# Patient Record
Sex: Female | Born: 1970 | Race: White | Hispanic: No | Marital: Married | Smoking: Never smoker
Health system: Southern US, Community
[De-identification: ages and names within clinical notes are randomized; demographics above are authoritative.]

## PROBLEM LIST (undated history)

## (undated) DIAGNOSIS — E119 Type 2 diabetes mellitus without complications: Secondary | ICD-10-CM

## (undated) DIAGNOSIS — I272 Pulmonary hypertension, unspecified: Secondary | ICD-10-CM

## (undated) DIAGNOSIS — I10 Essential (primary) hypertension: Secondary | ICD-10-CM

## (undated) HISTORY — DX: Pulmonary hypertension, unspecified: I27.20

## (undated) HISTORY — DX: Essential (primary) hypertension: I10

## (undated) HISTORY — DX: Type 2 diabetes mellitus without complications: E11.9

---

## 2008-05-08 ENCOUNTER — Ambulatory Visit: Payer: Self-pay | Admitting: Internal Medicine

## 2011-08-31 ENCOUNTER — Ambulatory Visit: Payer: Self-pay

## 2012-01-27 ENCOUNTER — Ambulatory Visit: Payer: Self-pay

## 2012-08-31 ENCOUNTER — Ambulatory Visit: Payer: Self-pay

## 2013-09-04 ENCOUNTER — Ambulatory Visit: Payer: Self-pay

## 2013-12-23 IMAGING — CR DG HAND COMPLETE 3+V*L*
1 series · 3 of 3 positions shown · non-contrast
Comparison: none

REASON FOR EXAM: tingling pain
COMMENTS:

PROCEDURE:     KDR - KDXR HAND LT COMPLETE W/OBLIQUES  - January 27, 2012  [DATE]
RESULT:     No fracture, dislocation or other acute bony abnormality is
seen. No periosteal reactive changes are noted. No arthritic changes are
identified. No soft tissue foreign body is seen.

[Series 1: pa · 0.17mm/px · 3 of 3 slices shown]
[im 1/3]
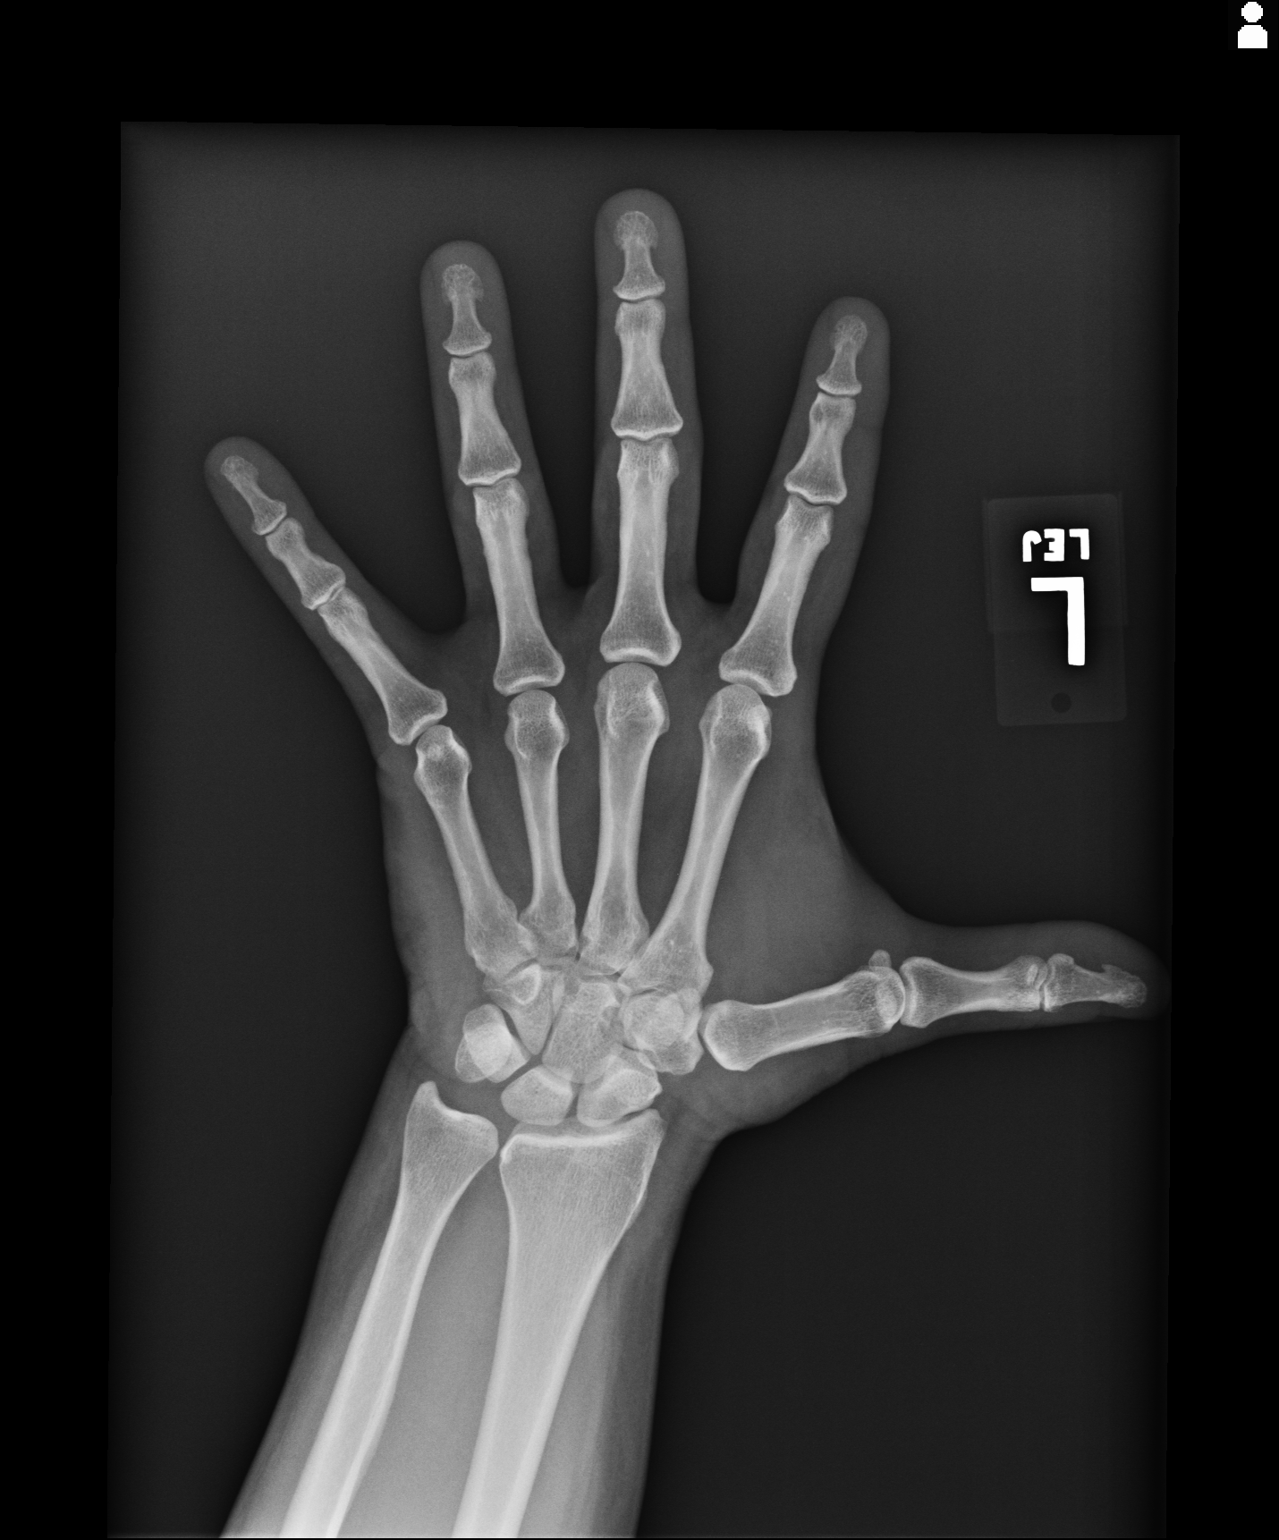
[im 2/3]
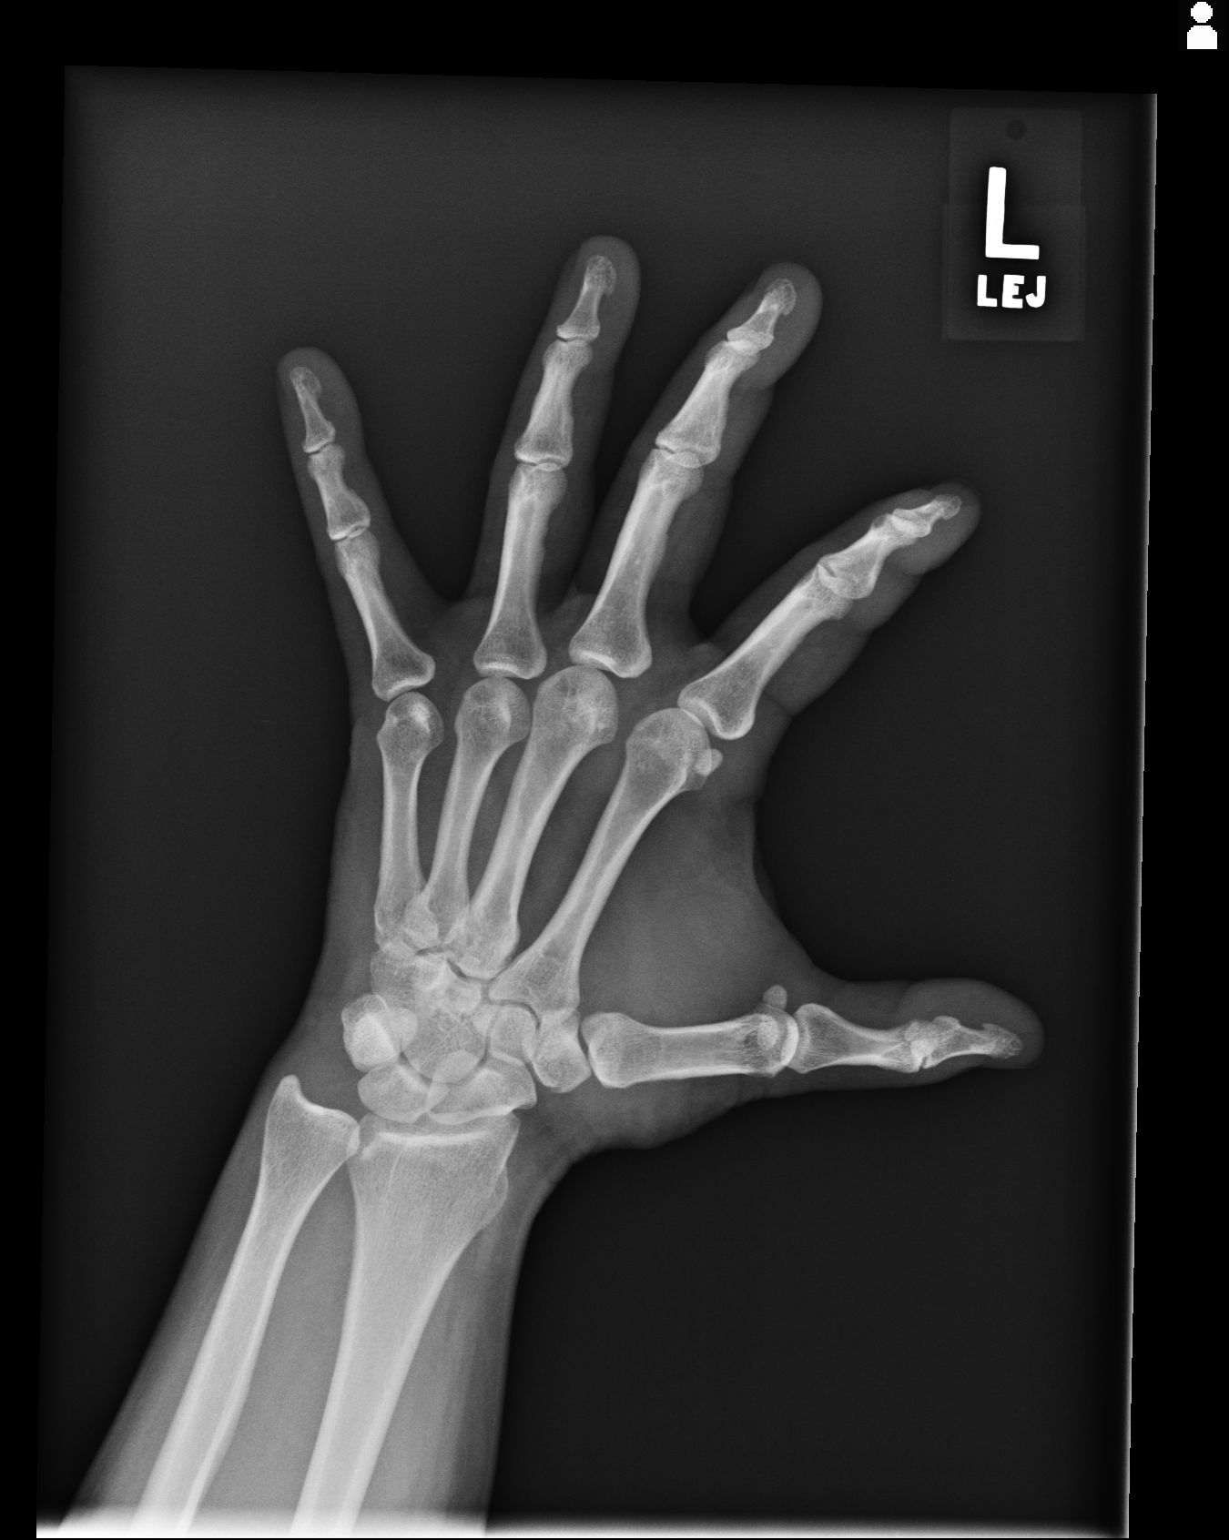
[im 3/3]
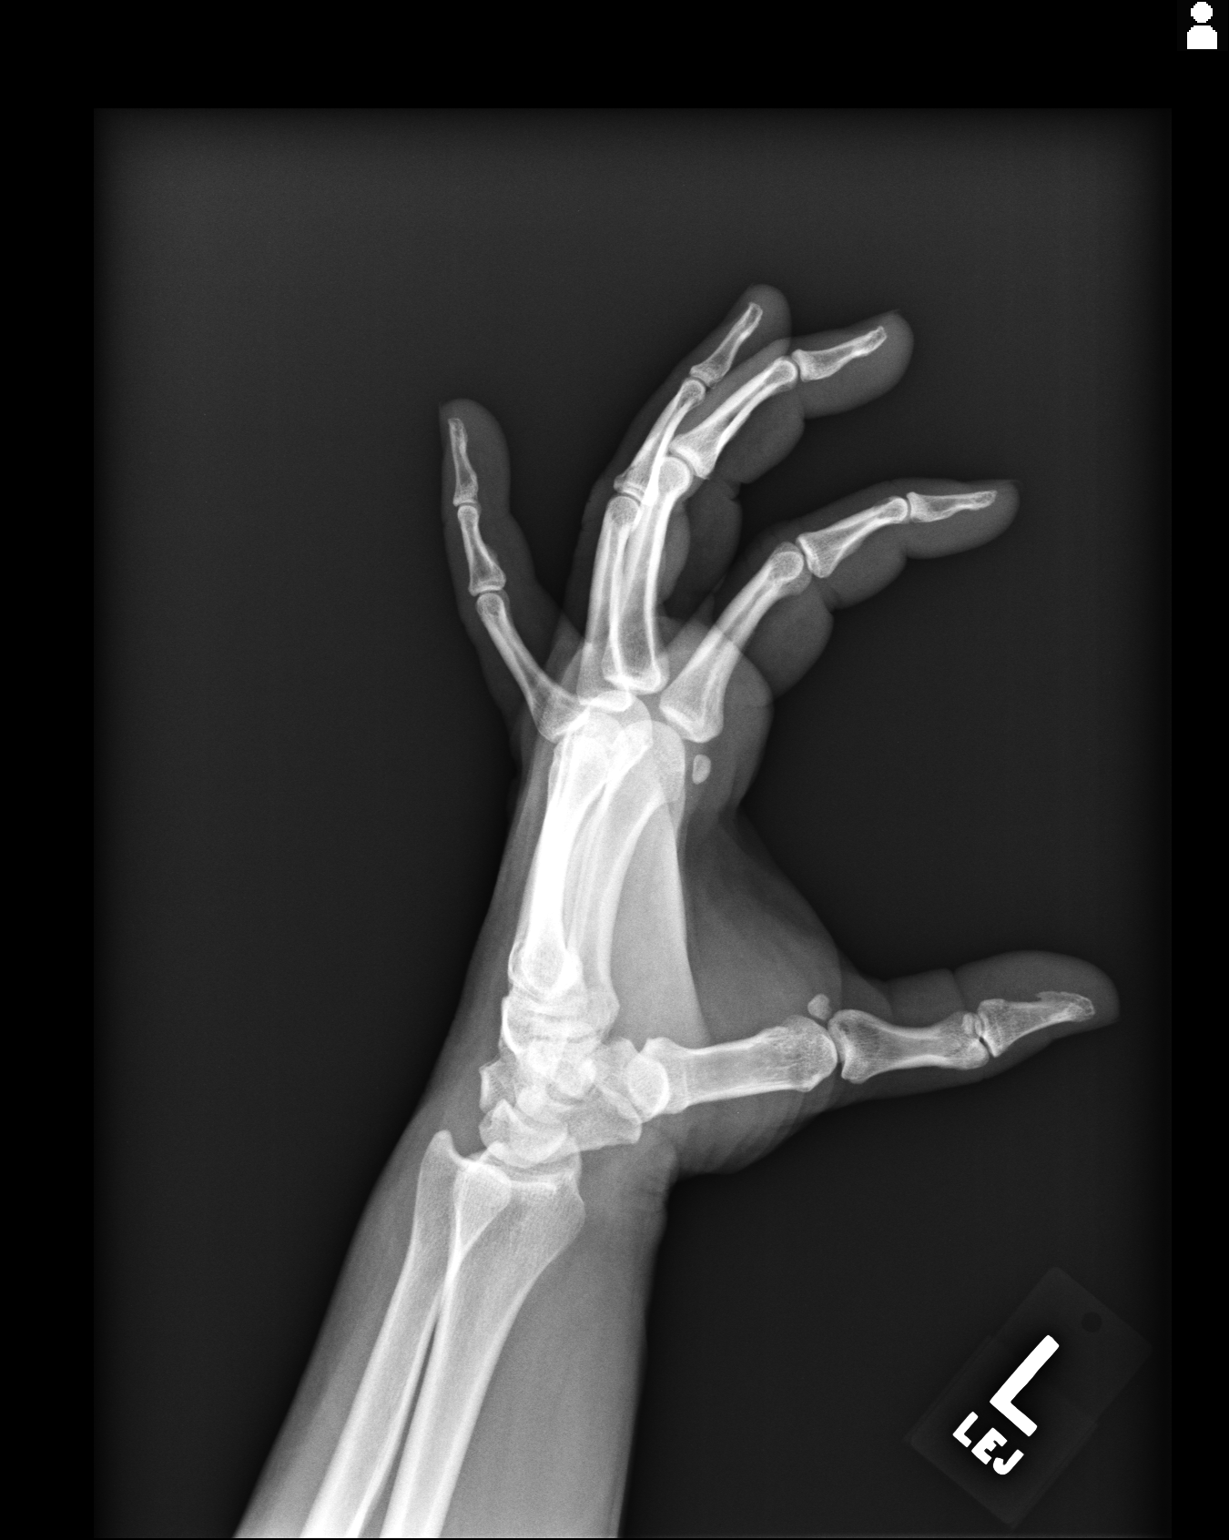

[3 of 3 positions shown; findings below may reference images not displayed]

IMPRESSION: 1.     No significant abnormalities are noted.

## 2014-07-28 IMAGING — MG MM CAD SCREENING MAMMO
1 series · 6 of 6 positions shown · non-contrast
Comparison: none

REASON FOR EXAM: SCR MAMMO NO ORDER
COMMENTS:

PROCEDURE:     MAM - MAM DGTL SCRN MAM NO ORDER W/CAD  - August 31, 2012  [DATE]
RESULT:     Scattered fibroglandular parenchymal pattern is present. No
mass. No pathologic clustered calcification. CAD evaluation nonfocal. Exam
stable from prior study of 08/31/2011.

[R CC · right · 6 of 6 slices shown]
[im 1/6]
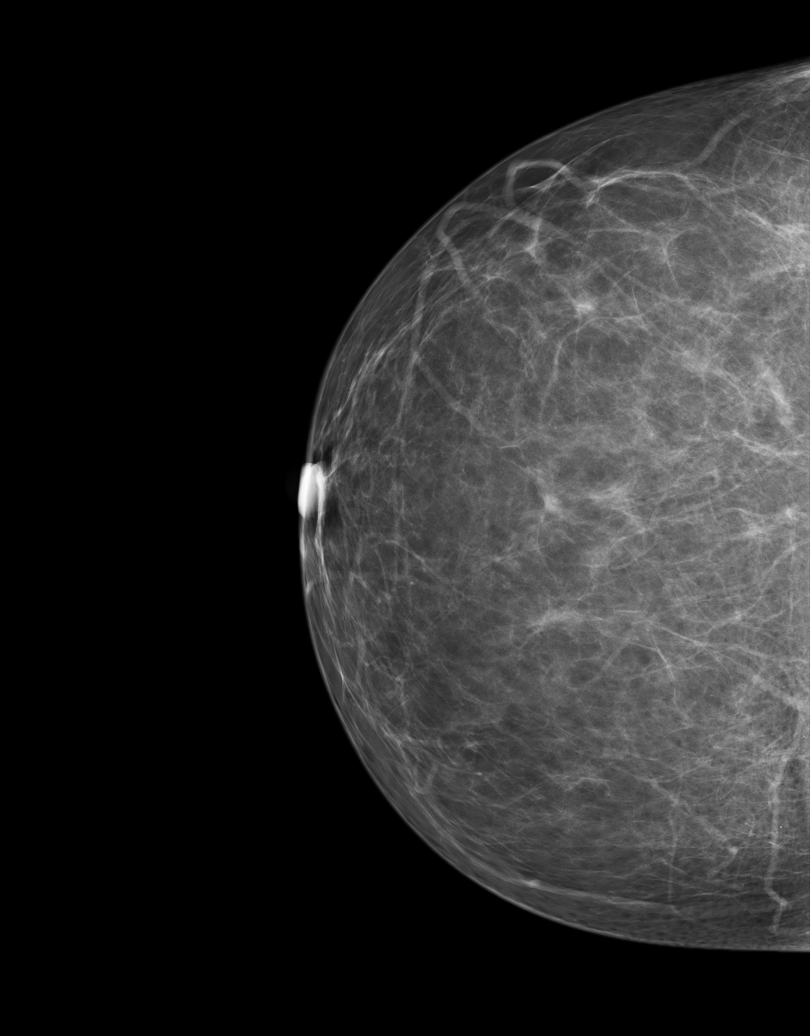
[im 2/6]
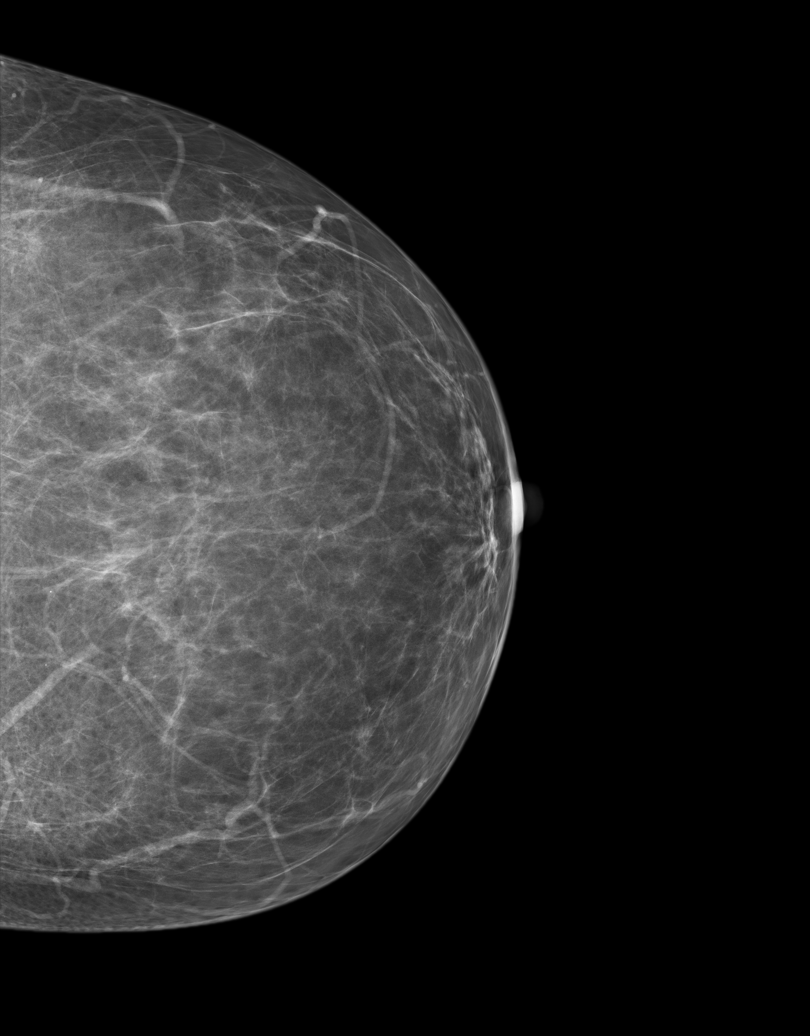
[im 3/6]
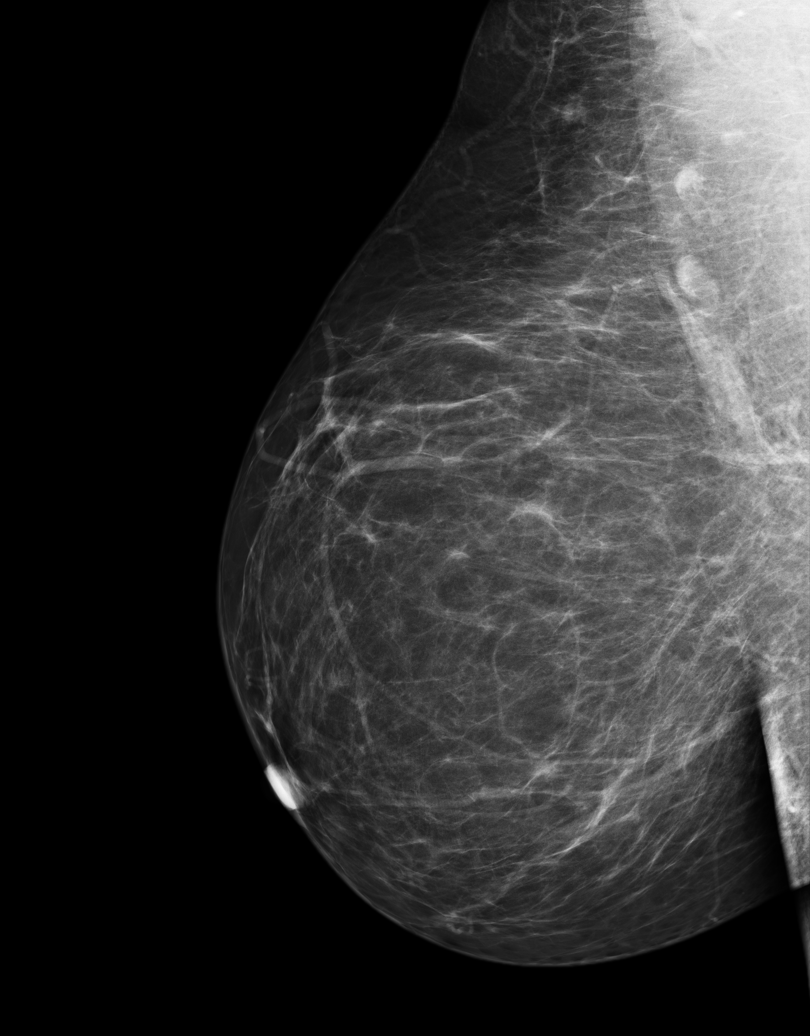
[im 4/6]
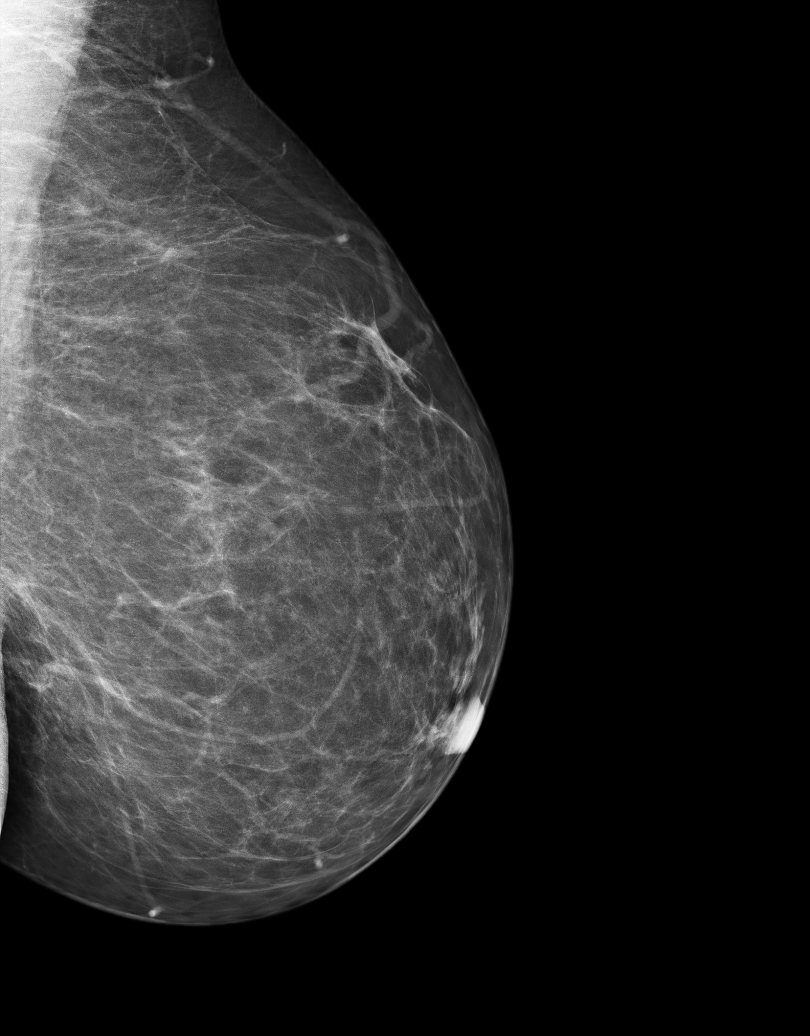
[im 5/6]
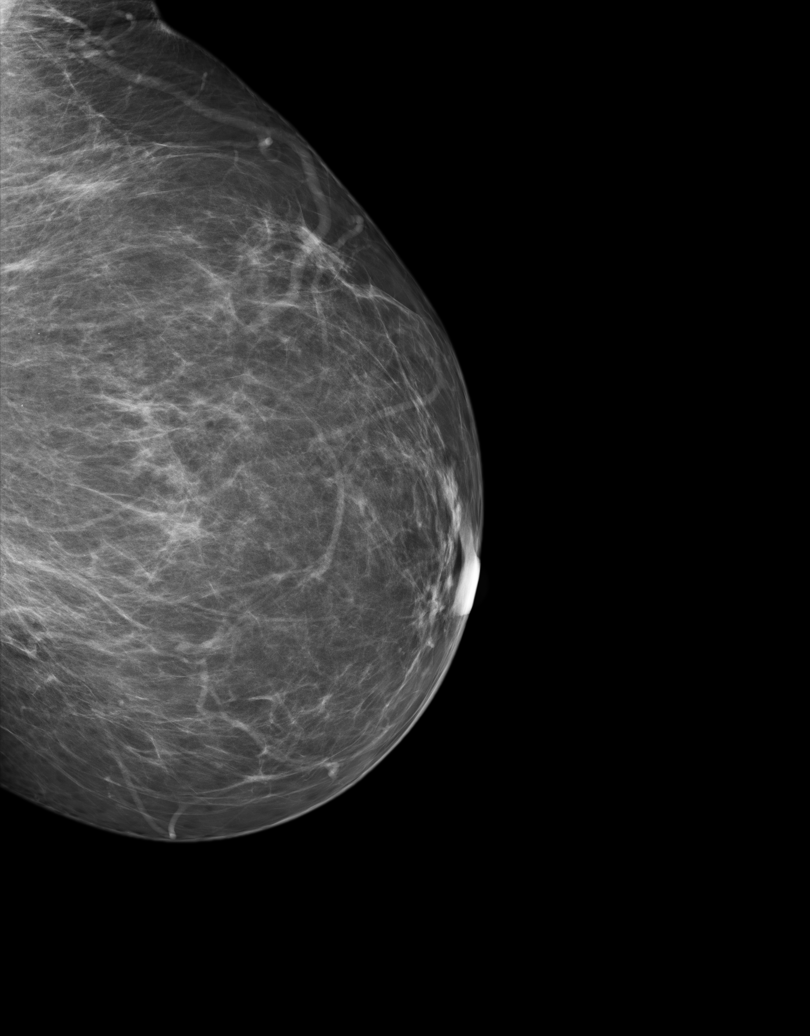
[im 6/6]
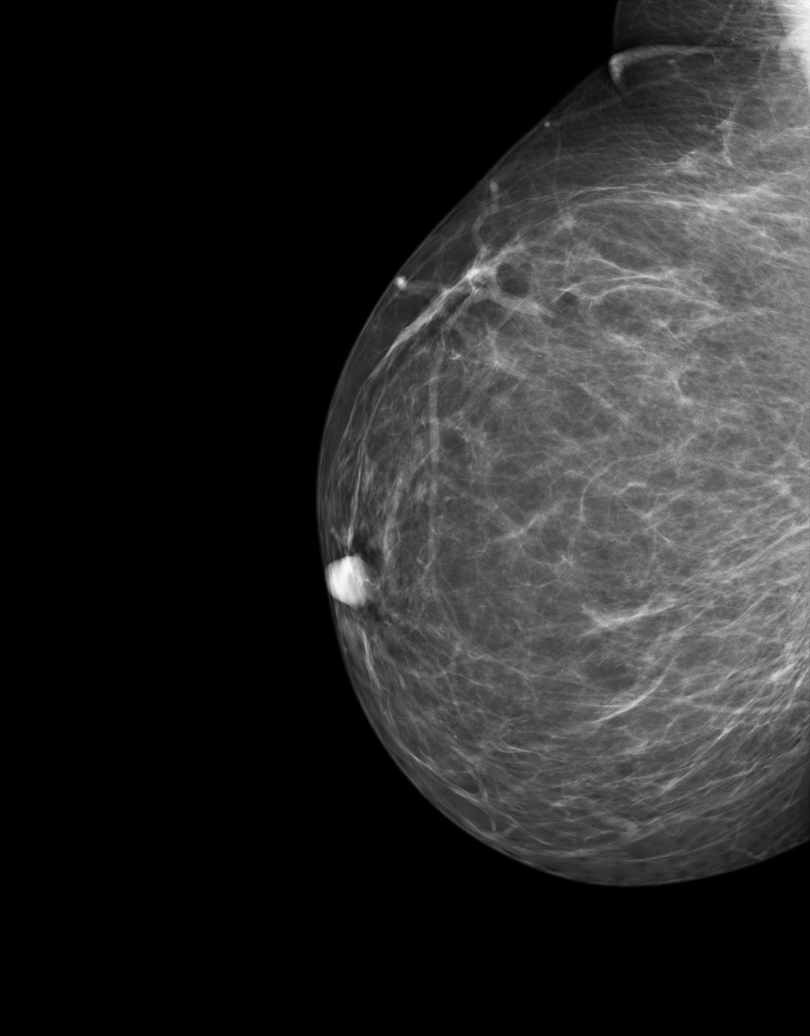

[6 of 6 positions shown; findings below may reference images not displayed]

IMPRESSION: Stable benign exam.

BI-RADS: Category 1 - Negative

A NEGATIVE MAMMOGRAM REPORT DOES NOT PRECLUDE BIOPSY OR OTHER EVALUATION OF
A CLINICALLY PALPABLE OR OTHERWISE SUSPICIOUS MASS OR LESION. BREAST CANCER
MAY NOT BE DETECTED IN UP TO 10% OF CASES.

## 2017-09-28 ENCOUNTER — Ambulatory Visit: Payer: Self-pay | Admitting: Nurse Practitioner

## 2017-11-09 ENCOUNTER — Other Ambulatory Visit: Payer: Self-pay

## 2017-11-09 MED ORDER — LISINOPRIL 40 MG PO TABS: ORAL_TABLET | ORAL | 1 refills | Status: DC

## 2017-11-09 MED ORDER — GLYBURIDE-METFORMIN 2.5-500 MG PO TABS: ORAL_TABLET | ORAL | 1 refills | Status: DC

## 2017-11-09 MED ORDER — FUROSEMIDE 20 MG PO TABS: ORAL_TABLET | ORAL | 5 refills | Status: DC

## 2017-11-15 ENCOUNTER — Ambulatory Visit: Payer: BLUE CROSS/BLUE SHIELD | Admitting: Nurse Practitioner

## 2017-11-15 ENCOUNTER — Encounter: Payer: Self-pay | Admitting: Nurse Practitioner

## 2017-11-15 VITALS — BP 146/98 | HR 79 | Resp 16 | Ht 64.0 in | Wt 223.2 lb

## 2017-11-15 DIAGNOSIS — I10 Essential (primary) hypertension: Secondary | ICD-10-CM | POA: Diagnosis not present

## 2017-11-15 DIAGNOSIS — E010 Iodine-deficiency related diffuse (endemic) goiter: Secondary | ICD-10-CM | POA: Diagnosis not present

## 2017-11-15 DIAGNOSIS — E1165 Type 2 diabetes mellitus with hyperglycemia: Secondary | ICD-10-CM

## 2017-11-15 LAB — POCT GLYCOSYLATED HEMOGLOBIN (HGB A1C): HEMOGLOBIN A1C: 7

## 2017-11-15 MED ORDER — ACCU-CHEK FASTCLIX LANCETS MISC: 4 refills | Status: AC

## 2017-11-15 MED ORDER — GLUCOSE BLOOD VI STRP: 1.0000 | ORAL_STRIP | Freq: Every day | 5 refills | Status: AC

## 2017-11-15 NOTE — Progress Notes (Signed)
[facility] [address] [place], [place] [address]  Internal MEDICINE  Office Visit Note  Patient Name: Carol Lewis  [id number]  [id number]  Date of Service: 11/24/2017  Chief Complaint  Patient presents with  . Diabetes    The patient has noted some swelling at the base of her throat and slightly over her right collar bone. Has been present for a few weeks. Did not fall or injure herself that she can remember. States that sometimes it feels like there is something "stuck" in her throat when she swallows.    Diabetes  She presents for her follow-up diabetic visit. She has type 2 diabetes mellitus. No MedicAlert identification noted. Her disease course has been stable. Hypoglycemia symptoms include headaches. Associated symptoms include fatigue, polydipsia and polyuria. Pertinent negatives for diabetes include no chest pain. There are no hypoglycemic complications. Symptoms are stable. There are no diabetic complications. Risk factors for coronary artery disease include dyslipidemia, hypertension and obesity. Current diabetic treatment includes oral agent (monotherapy). She is compliant with treatment all of the time. Her weight is stable. She is following a diabetic diet. Meal planning includes avoidance of concentrated sweets. She has not had a previous visit with a dietitian. She participates in exercise daily. There is no change in her home blood glucose trend. An ACE inhibitor/angiotensin II receptor blocker is being taken. She does not see a podiatrist.Eye exam is current.    Pt is here for routine follow up.    Current Medication: Outpatient Encounter Medications as of 11/15/2017  Medication Sig  . ACCU-CHEK FASTCLIX LANCETS MISC Use once daily diag e11.65  . furosemide (LASIX) 20 MG tablet Take 1 tab po by daily for swelling  . glucose blood (ACCU-CHEK GUIDE) test strip 1 each by Other route daily. Use as instructed  . glyBURIDE-metformin (GLUCOVANCE) 2.5-500 MG  tablet Take 1 tab by po in the morning and 2 tab in the evening  . hydrochlorothiazide (HYDRODIURIL) 25 MG tablet TK 1 T PO D FOR BLOOD PRESSURE  . lisinopril (PRINIVIL,ZESTRIL) 40 MG tablet Take 1 tab po daily  . [DISCONTINUED] ACCU-CHEK FASTCLIX LANCETS MISC by Does not apply route daily. Use once daily diag e11.65  . [DISCONTINUED] glucose blood (ACCU-CHEK GUIDE) test strip 1 each by Other route daily. Use as instructed    No facility-administered encounter medications on file as of 11/15/2017.     Surgical History: History reviewed. No pertinent surgical history.  Medical History: Past Medical History:  Diagnosis Date  . Diabetes mellitus without complication (HCC)   . Hypertension   . Pulmonary hypertension (HCC)     Family History: Family History  Problem Relation Age of Onset  . Breast cancer Mother   . Diabetes Mother   . Bladder Cancer Father     Social History   Socioeconomic History  . Marital status: Married    Spouse name: Not on file  . Number of children: Not on file  . Years of education: Not on file  . Highest education level: Not on file  Social Needs  . Financial resource strain: Not on file  . Food insecurity - worry: Not on file  . Food insecurity - inability: Not on file  . Transportation needs - medical: Not on file  . Transportation needs - non-medical: Not on file  Occupational History  . Not on file  Tobacco Use  . Smoking status: Never Smoker  . Smokeless tobacco: Never Used  Substance and Sexual Activity  . Alcohol  use: No    Frequency: Never  . Drug use: No  . Sexual activity: Not on file  Other Topics Concern  . Not on file  Social History Narrative  . Not on file      Review of Systems  Constitutional: Positive for fatigue. Negative for activity change and appetite change.  HENT: Negative for congestion, postnasal drip, sinus pressure, sinus pain, sneezing and voice change.        Feels swelling in her throat.   Eyes:  Negative for photophobia, pain, discharge, redness and itching.  Respiratory: Negative for shortness of breath and wheezing.   Cardiovascular: Negative for chest pain, palpitations and leg swelling.  Gastrointestinal: Negative for diarrhea, nausea and vomiting.  Endocrine: Positive for polydipsia and polyuria.  Musculoskeletal: Negative for arthralgias, back pain and myalgias.  Skin: Negative for rash.  Allergic/Immunologic: Positive for environmental allergies.  Neurological: Positive for headaches.  Hematological: Negative for adenopathy. Does not bruise/bleed easily.   Today's Vitals   11/15/17 1406  BP: (!) 146/98  Pulse: 79  Resp: 16  SpO2: 97%  Weight: 223 lb 3.2 oz (101.2 kg)  Height: 5\' 4"  (1.626 m)    Physical Exam  Constitutional: She is oriented to person, place, and time. She appears well-developed and well-nourished. No distress.  HENT:  Head: Normocephalic and atraumatic.  Mouth/Throat: Oropharynx is clear and moist. No oropharyngeal exudate.  Eyes: EOM are normal. Pupils are equal, round, and reactive to light.  Neck: Normal range of motion. Neck supple. No JVD present. No tracheal deviation present. Thyromegaly present.  Cardiovascular: Normal rate, regular rhythm and normal heart sounds. Exam reveals no gallop and no friction rub.  No murmur heard. Pulmonary/Chest: Effort normal and breath sounds normal. No respiratory distress. She has no wheezes. She has no rales. She exhibits no tenderness.  Abdominal: Soft. Bowel sounds are normal. There is no tenderness.  Musculoskeletal: Normal range of motion.  Lymphadenopathy:    She has no cervical adenopathy.  Neurological: She is alert and oriented to person, place, and time. No cranial nerve deficit.  Skin: Skin is warm and dry. She is not diaphoretic.  Psychiatric: She has a normal mood and affect. Her behavior is normal. Judgment and thought content normal.  Nursing note and vitals reviewed.  Assessment/Plan: 1.  Uncontrolled type 2 diabetes mellitus with hyperglycemia (HCC) - POCT HgB A1C is 7.0 today. Continue diabetic medication as prescribed. - glucose blood (ACCU-CHEK GUIDE) test strip; 1 each by Other route daily. Use as instructed  Dispense: 100 each; Refill: 5 - ACCU-CHEK FASTCLIX LANCETS MISC; Use once daily diag e11.65  Dispense: 100 each; Refill: 4  2. Thyromegaly - US Soft Tissue Head/Neck; Future  3. Essential hypertension Stable.continue bp medication as prescribed.   General Counseling: chyler creely understanding of the findings of todays visit and agrees with plan of treatment. I have discussed any further diagnostic evaluation that may be needed or ordered today. We also reviewed her medications today. she has been encouraged to call the office with any questions or concerns that should arise related to todays visit.  This patient was seen by Vincent Gros, FNP- C in Collaboration with Dr Lyndon Code as a part of collaborative care agreement    Orders Placed This Encounter  Procedures  . US Soft Tissue Head/Neck  . POCT HgB A1C    Meds ordered this encounter  Medications  . glucose blood (ACCU-CHEK GUIDE) test strip    Sig: 1 each by Other route  daily. Use as instructed    Dispense:  100 each    Refill:  5    Order Specific Question:   Supervising Provider    Answer:   Lyndon CodeKHAN, FOZIA M [[id number]]  . ACCU-CHEK FASTCLIX LANCETS MISC    Sig: Use once daily diag e11.65    Dispense:  100 each    Refill:  4    Order Specific Question:   Supervising Provider    Answer:   Lyndon CodeKHAN, FOZIA M [[id number]]    Time spent: 6515 Minutes    Dr Lyndon CodeFozia M Khan Internal medicine

## 2017-11-21 ENCOUNTER — Ambulatory Visit (INDEPENDENT_AMBULATORY_CARE_PROVIDER_SITE_OTHER): Payer: BLUE CROSS/BLUE SHIELD

## 2017-11-21 DIAGNOSIS — E010 Iodine-deficiency related diffuse (endemic) goiter: Secondary | ICD-10-CM

## 2017-11-24 ENCOUNTER — Encounter: Payer: Self-pay | Admitting: Nurse Practitioner

## 2017-11-24 DIAGNOSIS — I10 Essential (primary) hypertension: Secondary | ICD-10-CM | POA: Insufficient documentation

## 2017-12-01 ENCOUNTER — Telehealth: Payer: Self-pay

## 2017-12-01 NOTE — Telephone Encounter (Signed)
Advised pt of test results and that test will need to be repeated in 1 year.  dbs

## 2017-12-01 NOTE — Telephone Encounter (Signed)
-----   Message from Carlean JewsHeather E Boscia, NP sent at 11/28/2017  5:50 PM EST ----- - please let the patient know that ultrasound of thyroid showed two small nodules on right side of thyroid. Both less than 1cm in diameter. Will watch. Repeat ultrasound in 1 year. thanks

## 2018-02-16 ENCOUNTER — Ambulatory Visit (INDEPENDENT_AMBULATORY_CARE_PROVIDER_SITE_OTHER): Payer: BLUE CROSS/BLUE SHIELD | Admitting: Nurse Practitioner

## 2018-02-16 ENCOUNTER — Encounter: Payer: Self-pay | Admitting: Nurse Practitioner

## 2018-02-16 VITALS — BP 159/98 | HR 80 | Resp 16 | Ht 63.0 in | Wt 222.0 lb

## 2018-02-16 DIAGNOSIS — I10 Essential (primary) hypertension: Secondary | ICD-10-CM | POA: Diagnosis not present

## 2018-02-16 DIAGNOSIS — R300 Dysuria: Secondary | ICD-10-CM | POA: Diagnosis not present

## 2018-02-16 DIAGNOSIS — Z0001 Encounter for general adult medical examination with abnormal findings: Secondary | ICD-10-CM | POA: Diagnosis not present

## 2018-02-16 DIAGNOSIS — E559 Vitamin D deficiency, unspecified: Secondary | ICD-10-CM

## 2018-02-16 DIAGNOSIS — E1165 Type 2 diabetes mellitus with hyperglycemia: Secondary | ICD-10-CM

## 2018-02-16 DIAGNOSIS — E782 Mixed hyperlipidemia: Secondary | ICD-10-CM | POA: Diagnosis not present

## 2018-02-16 DIAGNOSIS — Z1239 Encounter for other screening for malignant neoplasm of breast: Secondary | ICD-10-CM

## 2018-02-16 DIAGNOSIS — Z23 Encounter for immunization: Secondary | ICD-10-CM | POA: Diagnosis not present

## 2018-02-16 DIAGNOSIS — Z1231 Encounter for screening mammogram for malignant neoplasm of breast: Secondary | ICD-10-CM

## 2018-02-16 LAB — POCT GLYCOSYLATED HEMOGLOBIN (HGB A1C): Hemoglobin A1C: 7.1 % — AB (ref 4.0–5.6)

## 2018-02-16 MED ORDER — TETANUS-DIPHTH-ACELL PERTUSSIS 5-2.5-18.5 LF-MCG/0.5 IM SUSP: 0.5000 mL | Freq: Once | INTRAMUSCULAR | 0 refills | Status: AC

## 2018-02-16 MED ORDER — HYDROCHLOROTHIAZIDE 25 MG PO TABS: 25.0000 mg | ORAL_TABLET | Freq: Every day | ORAL | 5 refills | Status: DC

## 2018-02-16 MED ORDER — GLYBURIDE-METFORMIN 2.5-500 MG PO TABS: ORAL_TABLET | ORAL | 5 refills | Status: DC

## 2018-02-16 MED ORDER — LISINOPRIL 40 MG PO TABS: ORAL_TABLET | ORAL | 5 refills | Status: DC

## 2018-02-16 NOTE — Progress Notes (Signed)
[facility] [address] [place], [place] [address]  Internal MEDICINE  Office Visit Note  Patient Name: Carol Lewis  [id number]  [id number]  Date of Service: 03/12/2018   Pt is here for routine health maintenance examination  Chief Complaint  Patient presents with  . Annual Exam  . Diabetes  . Hypertension     Diabetes  She presents for her follow-up diabetic visit. She has type 2 diabetes mellitus. Her disease course has been stable. There are no hypoglycemic associated symptoms. Pertinent negatives for hypoglycemia include no dizziness, headaches or nervousness/anxiousness. Associated symptoms include fatigue, polydipsia and polyuria. Pertinent negatives for diabetes include no chest pain. There are no hypoglycemic complications. Symptoms are stable. There are no diabetic complications. Risk factors for coronary artery disease include diabetes mellitus, dyslipidemia, hypertension, obesity and post-menopausal. Current diabetic treatment includes oral agent (dual therapy). She is compliant with treatment most of the time. She is following a generally healthy diet. She has had a previous visit with a dietitian. She participates in exercise daily. There is no change in her home blood glucose trend. An ACE inhibitor/angiotensin II receptor blocker is being taken. She does not see a podiatrist.Eye exam is current.  Hypertension  The current episode started more than 1 year ago. The problem is unchanged. The problem is controlled. Associated symptoms include peripheral edema. Pertinent negatives include no chest pain, headaches, palpitations or shortness of breath. There are no associated agents to hypertension. Risk factors for coronary artery disease include diabetes mellitus, dyslipidemia, obesity and post-menopausal state. Past treatments include ACE inhibitors and diuretics. The current treatment provides moderate improvement. There are no compliance problems.       Current Medication: Outpatient Encounter Medications as of 02/16/2018  Medication Sig  . ACCU-CHEK FASTCLIX LANCETS MISC Use once daily diag e11.65  . furosemide (LASIX) 20 MG tablet Take 1 tab po by daily for swelling  . glucose blood (ACCU-CHEK GUIDE) test strip 1 each by Other route daily. Use as instructed  . glyBURIDE-metformin (GLUCOVANCE) 2.5-500 MG tablet Take 1 tab by po in the morning and 2 tab in the evening  . hydrochlorothiazide (HYDRODIURIL) 25 MG tablet Take 1 tablet (25 mg total) by mouth daily.  Marland Kitchen lisinopril (PRINIVIL,ZESTRIL) 40 MG tablet Take 1 tab po daily  . [EXPIRED] Tdap (BOOSTRIX) 5-2.5-18.5 LF-MCG/0.5 injection Inject 0.5 mLs into the muscle once for 1 dose.  . [DISCONTINUED] glyBURIDE-metformin (GLUCOVANCE) 2.5-500 MG tablet Take 1 tab by po in the morning and 2 tab in the evening  . [DISCONTINUED] hydrochlorothiazide (HYDRODIURIL) 25 MG tablet TK 1 T PO D FOR BLOOD PRESSURE  . [DISCONTINUED] lisinopril (PRINIVIL,ZESTRIL) 40 MG tablet Take 1 tab po daily   No facility-administered encounter medications on file as of 02/16/2018.     Surgical History: History reviewed. No pertinent surgical history.  Medical History: Past Medical History:  Diagnosis Date  . Diabetes mellitus without complication (HCC)   . Hypertension   . Pulmonary hypertension (HCC)     Family History: Family History  Problem Relation Age of Onset  . Breast cancer Mother   . Diabetes Mother   . Bladder Cancer Father       Review of Systems  Constitutional: Positive for fatigue. Negative for activity change and appetite change.  HENT: Negative for congestion, postnasal drip, sinus pressure, sinus pain, sneezing and voice change.        Feels swelling in her throat.   Eyes: Negative.  Negative for photophobia, pain, discharge, redness and  itching.  Respiratory: Negative for cough, shortness of breath and wheezing.   Cardiovascular: Negative for chest pain, palpitations and leg  swelling.  Gastrointestinal: Negative for abdominal pain, diarrhea, nausea and vomiting.  Endocrine: Positive for polydipsia and polyuria.  Genitourinary: Negative for dysuria, flank pain, frequency, menstrual problem and urgency.  Musculoskeletal: Negative for arthralgias, back pain and myalgias.  Skin: Negative for rash.  Allergic/Immunologic: Positive for environmental allergies.  Neurological: Negative for dizziness, light-headedness and headaches.  Hematological: Negative for adenopathy. Does not bruise/bleed easily.  Psychiatric/Behavioral: Negative for dysphoric mood and sleep disturbance. The patient is not nervous/anxious.      Today's Vitals   02/16/18 1458  BP: (!) 159/98  Pulse: 80  Resp: 16  SpO2: 98%  Weight: 222 lb (100.7 kg)  Height:  (1.6 m)    Physical Exam  Constitutional: She is oriented to person, place, and time. She appears well-developed and well-nourished. No distress.  HENT:  Head: Normocephalic and atraumatic.  Nose: Nose normal.  Mouth/Throat: Oropharynx is clear and moist. No oropharyngeal exudate.  Eyes: Pupils are equal, round, and reactive to light. Conjunctivae and EOM are normal.  Neck: Normal range of motion. Neck supple. No JVD present. No tracheal deviation present. Thyromegaly present.  Cardiovascular: Normal rate, regular rhythm, normal heart sounds and intact distal pulses. Exam reveals no gallop and no friction rub.  No murmur heard. Pulses:      Dorsalis pedis pulses are 2+ on the right side, and 2+ on the left side.       Posterior tibial pulses are 2+ on the right side, and 2+ on the left side.  Pulmonary/Chest: Effort normal and breath sounds normal. No respiratory distress. She has no wheezes. She has no rales. She exhibits no tenderness. Right breast exhibits no inverted nipple, no mass, no nipple discharge, no skin change and no tenderness. Left breast exhibits no inverted nipple, no mass, no nipple discharge, no skin change and  no tenderness.  Abdominal: Soft. Bowel sounds are normal. There is no tenderness.  Musculoskeletal: Normal range of motion.       Right foot: There is normal range of motion and no deformity.       Left foot: There is normal range of motion and no deformity.  Feet:  Right Foot:  Protective Sensation: 6 sites tested. 6 sites sensed.  Skin Integrity: Negative for ulcer, blister or warmth.  Left Foot:  Protective Sensation: 6 sites tested. 6 sites sensed.  Skin Integrity: Negative for ulcer, blister, skin breakdown or warmth.  Lymphadenopathy:    She has no cervical adenopathy.  Neurological: She is alert and oriented to person, place, and time. No cranial nerve deficit.  Skin: Skin is warm and dry. Capillary refill takes less than 2 seconds. She is not diaphoretic.  Psychiatric: She has a normal mood and affect. Her behavior is normal. Judgment and thought content normal.  Nursing note and vitals reviewed.    LABS: Recent Results (from the past 2160 hour(s))  Urinalysis, Routine w reflex microscopic     Status: None   Collection Time: 02/16/18 12:00 AM  Result Value Ref Range   Specific Gravity, UA 1.016 1.005 - 1.030   pH, UA 5.5 5.0 - 7.5   Color, UA Yellow Yellow   Appearance Ur Clear Clear   Leukocytes, UA Negative Negative   Protein, UA Negative Negative/Trace   Glucose, UA Negative Negative   Ketones, UA Negative Negative   RBC, UA Negative Negative  Bilirubin, UA Negative Negative   Urobilinogen, Ur 0.2 0.2 - 1.0 mg/dL   Nitrite, UA Negative Negative   Microscopic Examination Comment     Comment: Microscopic not indicated and not performed.  POCT HgB A1C     Status: Abnormal   Collection Time: 02/16/18  3:19 PM  Result Value Ref Range   Hemoglobin A1C 7.1 (A) 4.0 - 5.6 %   HbA1c, POC (prediabetic range)  5.7 - 6.4 %   HbA1c, POC (controlled diabetic range)  0.0 - 7.0 %   Assessment/Plan: 1. Encounter for general adult medical examination with abnormal  findings Annual health maintenance exam today. - CBC with Differential/Platelet - Comprehensive metabolic panel - TSH - T4, free  2. Uncontrolled type 2 diabetes mellitus with hyperglycemia (HCC) - POCT HgB A1C 7.1 today. Continue diabetic medication as prescribed. Refer for diabetic eye exam. - Comprehensive metabolic panel - TSH - T4, free - Microalbumin / creatinine urine ratio - Ambulatory referral to Ophthalmology - glyBURIDE-metformin (GLUCOVANCE) 2.5-500 MG tablet; Take 1 tab by po in the morning and 2 tab in the evening  Dispense: 90 tablet; Refill: 5  3. Essential hypertension - Comprehensive metabolic panel - hydrochlorothiazide (HYDRODIURIL) 25 MG tablet; Take 1 tablet (25 mg total) by mouth daily.  Dispense: 30 tablet; Refill: 5 - lisinopril (PRINIVIL,ZESTRIL) 40 MG tablet; Take 1 tab po daily  Dispense: 30 tablet; Refill: 5  4. Mixed hyperlipidemia - Lipid panel  5. Vitamin D deficiency - Vitamin D 1,25 dihydroxy  6. Dysuria - Urinalysis, Routine w reflex microscopic  7. Screening for breast cancer - MM DIGITAL SCREENING BILATERAL; Future  8. Need for prophylactic vaccination against diphtheria-tetanus-pertussis (DTP) - Tdap (BOOSTRIX) 5-2.5-18.5 LF-MCG/0.5 injection; Inject 0.5 mLs into the muscle once for 1 dose.  Dispense: 0.5 mL; Refill: 0  General Counseling: Lincoln verbalizes understanding of the findings of todays visit and agrees with plan of treatment. I have discussed any further diagnostic evaluation that may be needed or ordered today. We also reviewed her medications today. she has been encouraged to call the office with any questions or concerns that should arise related to todays visit.    Counseling:  Diabetes Counseling:  1. Addition of ACE inh/ ARB'S for nephroprotection. 2. Diabetic foot care, prevention of complications.  3.Exercise and lose weight.  4. Diabetic eye examination, 5. Monitor blood sugar closlely. nutrition counseling.   6.Sign and symptoms of hypoglycemia including shaking sweating,confusion and headaches.   This patient was seen by Vincent Gros, FNP- C in Collaboration with Dr Lyndon Code as a part of collaborative care agreement  Orders Placed This Encounter  Procedures  . MM DIGITAL SCREENING BILATERAL  . Urinalysis, Routine w reflex microscopic  . CBC with Differential/Platelet  . Comprehensive metabolic panel  . TSH  . T4, free  . Lipid panel  . Vitamin D 1,25 dihydroxy  . Microalbumin / creatinine urine ratio  . Ambulatory referral to Ophthalmology  . POCT HgB A1C    Meds ordered this encounter  Medications  . glyBURIDE-metformin (GLUCOVANCE) 2.5-500 MG tablet    Sig: Take 1 tab by po in the morning and 2 tab in the evening    Dispense:  90 tablet    Refill:  5    Order Specific Question:   Supervising Provider    Answer:   Lyndon Code [[id number]]  . hydrochlorothiazide (HYDRODIURIL) 25 MG tablet    Sig: Take 1 tablet (25 mg total) by mouth daily.  Dispense:  30 tablet    Refill:  5    Order Specific Question:   Supervising Provider    Answer:   Lyndon Code [[id number]]  . lisinopril (PRINIVIL,ZESTRIL) 40 MG tablet    Sig: Take 1 tab po daily    Dispense:  30 tablet    Refill:  5    Order Specific Question:   Supervising Provider    Answer:   Lyndon Code [[id number]]  . Tdap (BOOSTRIX) 5-2.5-18.5 LF-MCG/0.5 injection    Sig: Inject 0.5 mLs into the muscle once for 1 dose.    Dispense:  0.5 mL    Refill:  0    Order Specific Question:   Supervising Provider    Answer:   Lyndon Code [[id number]]    Time spent: 59 Minutes      Lyndon Code, MD  Internal Medicine

## 2018-02-17 LAB — URINALYSIS, ROUTINE W REFLEX MICROSCOPIC
BILIRUBIN UA: NEGATIVE
Glucose, UA: NEGATIVE
KETONES UA: NEGATIVE
LEUKOCYTES UA: NEGATIVE
Nitrite, UA: NEGATIVE
PH UA: 5.5 (ref 5.0–7.5)
PROTEIN UA: NEGATIVE
RBC UA: NEGATIVE
SPEC GRAV UA: 1.016 (ref 1.005–1.030)
UUROB: 0.2 mg/dL (ref 0.2–1.0)

## 2018-03-12 DIAGNOSIS — Z1239 Encounter for other screening for malignant neoplasm of breast: Secondary | ICD-10-CM

## 2018-03-12 DIAGNOSIS — E782 Mixed hyperlipidemia: Secondary | ICD-10-CM | POA: Insufficient documentation

## 2018-03-12 DIAGNOSIS — R300 Dysuria: Secondary | ICD-10-CM | POA: Insufficient documentation

## 2018-03-12 DIAGNOSIS — Z23 Encounter for immunization: Secondary | ICD-10-CM | POA: Insufficient documentation

## 2018-03-12 DIAGNOSIS — Z124 Encounter for screening for malignant neoplasm of cervix: Secondary | ICD-10-CM | POA: Insufficient documentation

## 2018-03-12 DIAGNOSIS — E559 Vitamin D deficiency, unspecified: Secondary | ICD-10-CM | POA: Insufficient documentation

## 2018-03-12 DIAGNOSIS — E1165 Type 2 diabetes mellitus with hyperglycemia: Secondary | ICD-10-CM | POA: Insufficient documentation

## 2018-06-16 LAB — CBC WITH DIFFERENTIAL/PLATELET
BASOS: 1 %
Basophils Absolute: 0.1 10*3/uL (ref 0.0–0.2)
EOS (ABSOLUTE): 0.5 10*3/uL — ABNORMAL HIGH (ref 0.0–0.4)
EOS: 7 %
HEMATOCRIT: 38.9 % (ref 34.0–46.6)
Hemoglobin: 13.4 g/dL (ref 11.1–15.9)
Immature Grans (Abs): 0 10*3/uL (ref 0.0–0.1)
Immature Granulocytes: 0 %
LYMPHS ABS: 1.6 10*3/uL (ref 0.7–3.1)
Lymphs: 21 %
MCH: 29.6 pg (ref 26.6–33.0)
MCHC: 34.4 g/dL (ref 31.5–35.7)
MCV: 86 fL (ref 79–97)
MONOCYTES: 4 %
MONOS ABS: 0.3 10*3/uL (ref 0.1–0.9)
Neutrophils Absolute: 4.9 10*3/uL (ref 1.4–7.0)
Neutrophils: 67 %
Platelets: 292 10*3/uL (ref 150–450)
RBC: 4.53 x10E6/uL (ref 3.77–5.28)
RDW: 13.4 % (ref 12.3–15.4)
WBC: 7.3 10*3/uL (ref 3.4–10.8)

## 2018-06-16 LAB — COMPREHENSIVE METABOLIC PANEL
A/G RATIO: 2 (ref 1.2–2.2)
ALT: 32 IU/L (ref 0–32)
AST: 22 IU/L (ref 0–40)
Albumin: 4.1 g/dL (ref 3.5–5.5)
Alkaline Phosphatase: 60 IU/L (ref 39–117)
BUN / CREAT RATIO: 14 (ref 9–23)
BUN: 11 mg/dL (ref 6–24)
Bilirubin Total: 0.3 mg/dL (ref 0.0–1.2)
CALCIUM: 9.1 mg/dL (ref 8.7–10.2)
CO2: 25 mmol/L (ref 20–29)
CREATININE: 0.78 mg/dL (ref 0.57–1.00)
Chloride: 98 mmol/L (ref 96–106)
GFR calc Af Amer: 103 mL/min/{1.73_m2} (ref >59)
GFR, EST NON AFRICAN AMERICAN: 90 mL/min/{1.73_m2} (ref >59)
GLOBULIN, TOTAL: 2.1 g/dL (ref 1.5–4.5)
Glucose: 170 mg/dL — ABNORMAL HIGH (ref 65–99)
Potassium: 4.2 mmol/L (ref 3.5–5.2)
SODIUM: 137 mmol/L (ref 134–144)
Total Protein: 6.2 g/dL (ref 6.0–8.5)

## 2018-06-16 LAB — VITAMIN D 1,25 DIHYDROXY
VITAMIN D3 1, 25 (OH): 34 pg/mL
Vitamin D 1, 25 (OH)2 Total: 34 pg/mL

## 2018-06-16 LAB — T4, FREE: FREE T4: 1 ng/dL (ref 0.82–1.77)

## 2018-06-16 LAB — TSH: TSH: 2.89 u[IU]/mL (ref 0.450–4.500)

## 2018-06-16 LAB — LIPID PANEL
CHOL/HDL RATIO: 3.7 ratio (ref 0.0–4.4)
Cholesterol, Total: 161 mg/dL (ref 100–199)
HDL: 43 mg/dL (ref >39)
LDL Calculated: 73 mg/dL (ref 0–99)
TRIGLYCERIDES: 225 mg/dL — AB (ref 0–149)
VLDL Cholesterol Cal: 45 mg/dL — ABNORMAL HIGH (ref 5–40)

## 2018-06-16 LAB — MICROALBUMIN / CREATININE URINE RATIO
CREATININE, UR: 318.1 mg/dL
Microalb/Creat Ratio: 9.4 mg/g creat (ref 0.0–30.0)
Microalbumin, Urine: 30 ug/mL

## 2018-06-20 ENCOUNTER — Encounter: Payer: Self-pay | Admitting: Nurse Practitioner

## 2018-06-20 NOTE — Progress Notes (Signed)
SCANNED IN DIABETIC EYE EXAM RESULTS. 

## 2018-06-23 ENCOUNTER — Ambulatory Visit (INDEPENDENT_AMBULATORY_CARE_PROVIDER_SITE_OTHER): Payer: BLUE CROSS/BLUE SHIELD | Admitting: Nurse Practitioner

## 2018-06-23 ENCOUNTER — Encounter: Payer: Self-pay | Admitting: Nurse Practitioner

## 2018-06-23 VITALS — BP 130/80 | HR 65 | Resp 16 | Ht 64.0 in | Wt 224.0 lb

## 2018-06-23 DIAGNOSIS — E782 Mixed hyperlipidemia: Secondary | ICD-10-CM | POA: Diagnosis not present

## 2018-06-23 DIAGNOSIS — E1165 Type 2 diabetes mellitus with hyperglycemia: Secondary | ICD-10-CM | POA: Diagnosis not present

## 2018-06-23 DIAGNOSIS — Z23 Encounter for immunization: Secondary | ICD-10-CM | POA: Diagnosis not present

## 2018-06-23 DIAGNOSIS — Z803 Family history of malignant neoplasm of breast: Secondary | ICD-10-CM | POA: Diagnosis not present

## 2018-06-23 DIAGNOSIS — I10 Essential (primary) hypertension: Secondary | ICD-10-CM

## 2018-06-23 LAB — POCT GLYCOSYLATED HEMOGLOBIN (HGB A1C): Hemoglobin A1C: 7.3 % — AB (ref 4.0–5.6)

## 2018-06-23 NOTE — Progress Notes (Signed)
[facility] [address] [place], [place] [address]  Internal MEDICINE  Office Visit Note  Patient Name: Carol Lewis  [id number]  [id number]  Date of Service: 07/04/2018  Chief Complaint  Patient presents with  . Hypertension  . Diabetes    Patient arrived to primary care for DM follow up. The previous A1C was 7.1 on 5/23. Patient's medication was increased from Glucovance 1.25-500 mg to 2.5-500 mg. Patient's A1C is 7.3 today. Patient reports that she feels well with new adjustment and does not report any side effects. Her morning blood sugars are in 130-160 range in the past weeks. She denies any signs and symptoms of hypoglycemia except when she gets very busy in the morning and forgets to eat. She describes the feeling "being nauseous" that resolves very quickly after snack. No other episodes of hypoglycemia or hyperglycemia were reported during this visit. Otherwise, patient does not have any current concerns. The patient has very long and strong family history of breast cancer. Her mother, two aunts, cousin, and grandmother have all been diagnosed and treated for breast cancer. Her grandmother had metastatic breast cancer and succumbed to this illness. The patient is interested in seeking genetic counseling to see if she is at increased risk for breast cancer. No one in her family has had this counseling or testing done. She believes she would consider prophylactic surgery to prevent breast cancer if her testing was positive.     Current Medication: Outpatient Encounter Medications as of 06/23/2018  Medication Sig  . ACCU-CHEK FASTCLIX LANCETS MISC Use once daily diag e11.65  . furosemide (LASIX) 20 MG tablet Take 1 tab po by daily for swelling  . glucose blood (ACCU-CHEK GUIDE) test strip 1 each by Other route daily. Use as instructed  . glyBURIDE-metformin (GLUCOVANCE) 2.5-500 MG tablet Take 1 tab by po in the morning and 2 tab in the evening  . hydrochlorothiazide  (HYDRODIURIL) 25 MG tablet Take 1 tablet (25 mg total) by mouth daily.  Marland Kitchen lisinopril (PRINIVIL,ZESTRIL) 40 MG tablet Take 1 tab po daily   No facility-administered encounter medications on file as of 06/23/2018.     Surgical History: History reviewed. No pertinent surgical history.  Medical History: Past Medical History:  Diagnosis Date  . Diabetes mellitus without complication (HCC)   . Hypertension   . Pulmonary hypertension (HCC)     Family History: Family History  Problem Relation Age of Onset  . Breast cancer Mother   . Diabetes Mother   . Bladder Cancer Father     Social History   Socioeconomic History  . Marital status: Married    Spouse name: Not on file  . Number of children: Not on file  . Years of education: Not on file  . Highest education level: Not on file  Occupational History  . Not on file  Social Needs  . Financial resource strain: Not on file  . Food insecurity:    Worry: Not on file    Inability: Not on file  . Transportation needs:    Medical: Not on file    Non-medical: Not on file  Tobacco Use  . Smoking status: Never Smoker  . Smokeless tobacco: Never Used  Substance and Sexual Activity  . Alcohol use: No    Frequency: Never  . Drug use: No  . Sexual activity: Not on file  Lifestyle  . Physical activity:    Days per week: Not on file    Minutes per session: Not on  file  . Stress: Not on file  Relationships  . Social connections:    Talks on phone: Not on file    Gets together: Not on file    Attends religious service: Not on file    Active member of club or organization: Not on file    Attends meetings of clubs or organizations: Not on file    Relationship status: Not on file  . Intimate partner violence:    Fear of current or ex partner: Not on file    Emotionally abused: Not on file    Physically abused: Not on file    Forced sexual activity: Not on file  Other Topics Concern  . Not on file  Social History Narrative  .  Not on file      Review of Systems  Constitutional: Negative for activity change, appetite change, chills, fatigue and fever.  HENT: Negative for congestion, ear pain, facial swelling, hearing loss, rhinorrhea, sinus pain and sore throat.   Eyes: Negative for pain.  Respiratory: Negative for apnea, cough, chest tightness, shortness of breath and wheezing.        Intermittent lower leg swelling.   Cardiovascular: Negative for chest pain and palpitations.  Gastrointestinal: Negative for abdominal distention, abdominal pain, anal bleeding, constipation, diarrhea, nausea and vomiting.  Endocrine: Negative for cold intolerance, heat intolerance, polydipsia, polyphagia and polyuria.       Blood sugars doing well   Genitourinary: Negative.  Negative for difficulty urinating, dyspareunia, dysuria, flank pain, frequency, hematuria and vaginal pain.  Musculoskeletal: Negative for arthralgias, back pain and myalgias.  Skin: Negative for color change and rash.  Allergic/Immunologic: Negative for environmental allergies.  Neurological: Negative for dizziness, tremors, facial asymmetry, weakness, numbness and headaches.  Hematological: Negative for adenopathy.  Psychiatric/Behavioral: Negative for behavioral problems and dysphoric mood. The patient is not nervous/anxious.    Today's Vitals   06/23/18 1510  BP: 130/80  Pulse: 65  Resp: 16  SpO2: 98%  Weight: 224 lb (101.6 kg)  Height: 5\' 4"  (1.626 m)    Physical Exam  Constitutional: She is oriented to person, place, and time. She appears well-developed and well-nourished.  HENT:  Head: Normocephalic.  Nose: Nose normal.  Mouth/Throat: Oropharynx is clear and moist.  Eyes: Pupils are equal, round, and reactive to light. EOM are normal.  Neck: Normal range of motion. Neck supple. No thyromegaly present.  Cardiovascular: Normal rate, regular rhythm and normal heart sounds.  Pulmonary/Chest: Effort normal and breath sounds normal. She has no  wheezes.  Abdominal: Soft. Bowel sounds are normal. There is no tenderness.  Musculoskeletal: Normal range of motion.  Lymphadenopathy:    She has no cervical adenopathy.  Neurological: She is alert and oriented to person, place, and time.  Skin: Skin is warm and dry.  Psychiatric: She has a normal mood and affect. Her behavior is normal. Judgment and thought content normal.  Nursing note and vitals reviewed.   Assessment/Plan: 1. Uncontrolled type 2 diabetes mellitus with hyperglycemia (HCC) - POCT HgB A1C 7.3. Continue current medication as prescribed. Encouraged her to more closely follow diabetic diet. Written information was provided.   2. Essential hypertension Stable. Continue BP medication as prescribed. Use fluid pill as needed and as prescribed.   3. Mixed hyperlipidemia Reviewed recent labs. Mild elevation. Continue cholesterol medication as prescribed. writtent information about prudent diet was provided.   4. Family history of breast cancer Strong family history of breast cancer. Will refer to oncology for genetic counseling to  determine if increased risk. Discuss prophylactic treatments if positive.  - Ambulatory referral to Oncology  5. Flu vaccine need - Flu Vaccine MDCK QUAD PF    General Counseling: Tenea verbalizes understanding of the findings of todays visit and agrees with plan of treatment. I have discussed any further diagnostic evaluation that may be needed or ordered today. We also reviewed her medications today. she has been encouraged to call the office with any questions or concerns that should arise related to todays visit.   Patient encouraged to reduce any sugar intake, encourage smaller but more frequent meals and decrease intake of calories to help to reduce the weight.   Diabetes Counseling:  1. Addition of ACE inh/ ARB'S for nephroprotection. Microalbumin is updated  2. Diabetic foot care, prevention of complications. Podiatry consult 3.  Exercise and lose weight.  4. Diabetic eye examination, Diabetic eye exam is updated  5. Monitor blood sugar closlely. nutrition counseling.  6. Sign and symptoms of hypoglycemia including shaking sweating,confusion and headaches.  This patient was seen by Vincent Gros FNP Collaboration with Dr Lyndon Code as a part of collaborative care agreement  Orders Placed This Encounter  Procedures  . Flu Vaccine MDCK QUAD PF  . Ambulatory referral to Oncology  . POCT HgB A1C      Time spent: 25 Minutes      Dr Lyndon Code Internal medicine

## 2018-07-04 ENCOUNTER — Ambulatory Visit: Payer: BLUE CROSS/BLUE SHIELD | Admitting: Adult Health

## 2018-07-04 ENCOUNTER — Encounter: Payer: Self-pay | Admitting: Adult Health

## 2018-07-04 VITALS — BP 134/82 | HR 80 | Resp 16 | Ht 64.5 in | Wt 222.0 lb

## 2018-07-04 DIAGNOSIS — I10 Essential (primary) hypertension: Secondary | ICD-10-CM

## 2018-07-04 DIAGNOSIS — E669 Obesity, unspecified: Secondary | ICD-10-CM

## 2018-07-04 DIAGNOSIS — Z23 Encounter for immunization: Secondary | ICD-10-CM | POA: Insufficient documentation

## 2018-07-04 DIAGNOSIS — R0600 Dyspnea, unspecified: Secondary | ICD-10-CM | POA: Diagnosis not present

## 2018-07-04 DIAGNOSIS — I270 Primary pulmonary hypertension: Secondary | ICD-10-CM

## 2018-07-04 DIAGNOSIS — Z803 Family history of malignant neoplasm of breast: Secondary | ICD-10-CM | POA: Insufficient documentation

## 2018-07-04 NOTE — Patient Instructions (Signed)

## 2018-07-04 NOTE — Progress Notes (Signed)
[facility] [address] [place], [place] [address]  Pulmonary Sleep Medicine   Office Visit Note  Patient Name: Carol Lewis DOB: Oct 09, 1970 MRN [id number]  Date of Service: 07/04/2018  Complaints/HPI: Pt here for follow up on pulmonary htn.  She reports continued SOB with exertion.  She denies chest pain or SOB. Her last echo was in 2017 showed a normal LVF with trace tricuspid regurg.  She reports no real changes at this time, however I feel its prudent to assess her Pulmonary HTN with a repeat echo at this time.  Especially since she reports intermittent fatigue.  She continues to deny hemoptysis, chest pain or palpitations.    ROS  General: (-) fever, (-) chills, (-) night sweats, (-) weakness Skin: (-) rashes, (-) itching,. Eyes: (-) visual changes, (-) redness, (-) itching. Nose and Sinuses: (-) nasal stuffiness or itchiness, (-) postnasal drip, (-) nosebleeds, (-) sinus trouble. Mouth and Throat: (-) sore throat, (-) hoarseness. Neck: (-) swollen glands, (-) enlarged thyroid, (-) neck pain. Respiratory: - cough, (-) bloody sputum, - shortness of breath, - wheezing. Cardiovascular: - ankle swelling, (-) chest pain. Lymphatic: (-) lymph node enlargement. Neurologic: (-) numbness, (-) tingling. Psychiatric: (-) anxiety, (-) depression   Current Medication: Outpatient Encounter Medications as of 07/04/2018  Medication Sig  . ACCU-CHEK FASTCLIX LANCETS MISC Use once daily diag e11.65  . furosemide (LASIX) 20 MG tablet Take 1 tab po by daily for swelling  . glucose blood (ACCU-CHEK GUIDE) test strip 1 each by Other route daily. Use as instructed  . glyBURIDE-metformin (GLUCOVANCE) 2.5-500 MG tablet Take 1 tab by po in the morning and 2 tab in the evening  . hydrochlorothiazide (HYDRODIURIL) 25 MG tablet Take 1 tablet (25 mg total) by mouth daily.  Marland Kitchen lisinopril (PRINIVIL,ZESTRIL) 40 MG tablet Take 1 tab po daily   No facility-administered encounter medications  on file as of 07/04/2018.     Surgical History: History reviewed. No pertinent surgical history.  Medical History: Past Medical History:  Diagnosis Date  . Diabetes mellitus without complication (HCC)   . Hypertension   . Pulmonary hypertension (HCC)     Family History: Family History  Problem Relation Age of Onset  . Breast cancer Mother   . Diabetes Mother   . Bladder Cancer Father     Social History: Social History   Socioeconomic History  . Marital status: Married    Spouse name: Not on file  . Number of children: Not on file  . Years of education: Not on file  . Highest education level: Not on file  Occupational History  . Not on file  Social Needs  . Financial resource strain: Not on file  . Food insecurity:    Worry: Not on file    Inability: Not on file  . Transportation needs:    Medical: Not on file    Non-medical: Not on file  Tobacco Use  . Smoking status: Never Smoker  . Smokeless tobacco: Never Used  Substance and Sexual Activity  . Alcohol use: No    Frequency: Never  . Drug use: No  . Sexual activity: Not on file  Lifestyle  . Physical activity:    Days per week: Not on file    Minutes per session: Not on file  . Stress: Not on file  Relationships  . Social connections:    Talks on phone: Not on file    Gets together: Not on file    Attends religious service:  Not on file    Active member of club or organization: Not on file    Attends meetings of clubs or organizations: Not on file    Relationship status: Not on file  . Intimate partner violence:    Fear of current or ex partner: Not on file    Emotionally abused: Not on file    Physically abused: Not on file    Forced sexual activity: Not on file  Other Topics Concern  . Not on file  Social History Narrative  . Not on file    Vital Signs: Blood pressure 134/82, pulse 80, resp. rate 16, height 5' 4.5" (1.638 m), weight 222 lb (100.7 kg), SpO2 98 %.  Examination: General  Appearance: The patient is well-developed, well-nourished, and in no distress. Skin: Gross inspection of skin unremarkable. Head: normocephalic, no gross deformities. Eyes: no gross deformities noted. ENT: ears appear grossly normal no exudates. Neck: Supple. No thyromegaly. No LAD. Respiratory: Clear to auscultation bilateraly. Cardiovascular: Normal S1 and S2 without murmur or rub. Extremities: No cyanosis. pulses are equal. Neurologic: Alert and oriented. No involuntary movements.  LABS: Recent Results (from the past 2160 hour(s))  CBC with Differential/Platelet     Status: Abnormal   Collection Time: 06/12/18  8:07 AM  Result Value Ref Range   WBC 7.3 3.4 - 10.8 x10E3/uL   RBC 4.53 3.77 - 5.28 x10E6/uL   Hemoglobin 13.4 11.1 - 15.9 g/dL   Hematocrit 16.1 09.6 - 46.6 %   MCV 86 79 - 97 fL   MCH 29.6 26.6 - 33.0 pg   MCHC 34.4 31.5 - 35.7 g/dL   RDW 04.5 40.9 - 81.1 %   Platelets 292 150 - 450 x10E3/uL   Neutrophils 67 Not Estab. %   Lymphs 21 Not Estab. %   Monocytes 4 Not Estab. %   Eos 7 Not Estab. %   Basos 1 Not Estab. %   Neutrophils Absolute 4.9 1.4 - 7.0 x10E3/uL   Lymphocytes Absolute 1.6 0.7 - 3.1 x10E3/uL   Monocytes Absolute 0.3 0.1 - 0.9 x10E3/uL   EOS (ABSOLUTE) 0.5 (H) 0.0 - 0.4 x10E3/uL   Basophils Absolute 0.1 0.0 - 0.2 x10E3/uL   Immature Granulocytes 0 Not Estab. %   Immature Grans (Abs) 0.0 0.0 - 0.1 x10E3/uL  Comprehensive metabolic panel     Status: Abnormal   Collection Time: 06/12/18  8:07 AM  Result Value Ref Range   Glucose 170 (H) 65 - 99 mg/dL   BUN 11 6 - 24 mg/dL   Creatinine, Ser 9.14 0.57 - 1.00 mg/dL   GFR calc non Af Amer 90 >59 mL/min/1.73   GFR calc Af Amer 103 >59 mL/min/1.73   BUN/Creatinine Ratio 14 9 - 23   Sodium 137 134 - 144 mmol/L   Potassium 4.2 3.5 - 5.2 mmol/L   Chloride 98 96 - 106 mmol/L   CO2 25 20 - 29 mmol/L   Calcium 9.1 8.7 - 10.2 mg/dL   Total Protein 6.2 6.0 - 8.5 g/dL   Albumin 4.1 3.5 - 5.5 g/dL    Globulin, Total 2.1 1.5 - 4.5 g/dL   Albumin/Globulin Ratio 2.0 1.2 - 2.2   Bilirubin Total 0.3 0.0 - 1.2 mg/dL   Alkaline Phosphatase 60 39 - 117 IU/L   AST 22 0 - 40 IU/L   ALT 32 0 - 32 IU/L  TSH     Status: None   Collection Time: 06/12/18  8:07 AM  Result Value Ref Range  TSH 2.890 0.450 - 4.500 uIU/mL  T4, free     Status: None   Collection Time: 06/12/18  8:07 AM  Result Value Ref Range   Free T4 1.00 0.82 - 1.77 ng/dL  Lipid panel     Status: Abnormal   Collection Time: 06/12/18  8:07 AM  Result Value Ref Range   Cholesterol, Total 161 100 - 199 mg/dL   Triglycerides 295 (H) 0 - 149 mg/dL   HDL 43 >62 mg/dL   VLDL Cholesterol Cal 45 (H) 5 - 40 mg/dL   LDL Calculated 73 0 - 99 mg/dL   Chol/HDL Ratio 3.7 0.0 - 4.4 ratio    Comment:                                   T. Chol/HDL Ratio                                             Men  Women                               1/2 Avg.Risk  3.4    3.3                                   Avg.Risk  5.0    4.4                                2X Avg.Risk  9.6    7.1                                3X Avg.Risk 23.4   11.0   Vitamin D 1,25 dihydroxy     Status: None   Collection Time: 06/12/18  8:07 AM  Result Value Ref Range   Vitamin D 1, 25 (OH)2 Total 34 pg/mL    Comment: Reference Range: Adults: 21 - 65    Vitamin D2 1, 25 (OH)2 <10 pg/mL   Vitamin D3 1, 25 (OH)2 34 pg/mL  Microalbumin / creatinine urine ratio     Status: None   Collection Time: 06/12/18  8:07 AM  Result Value Ref Range   Creatinine, Urine 318.1 Not Estab. mg/dL   Microalbumin, Urine 13.0 Not Estab. ug/mL   Microalb/Creat Ratio 9.4 0.0 - 30.0 mg/g creat    Comment:                      Normal:                0.0 -  30.0                      Albuminuria:          31.0 - 300.0                      Clinical albuminuria:       >300.0   POCT HgB A1C     Status: Abnormal   Collection Time: 06/23/18  3:28 PM  Result  Value Ref Range   Hemoglobin A1C 7.3 (A) 4.0 -  5.6 %   HbA1c POC (<> result, manual entry)     HbA1c, POC (prediabetic range)     HbA1c, POC (controlled diabetic range)      Radiology: Mm Add Views Bil Br Bccp Scr ([facility] Hx)  Result Date: 09/04/2013 * PRIOR REPORT IMPORTED FROM AN EXTERNAL SYSTEM * CLINICAL DATA:  Screening. EXAM: DIGITAL SCREENING BILATERAL MAMMOGRAM WITH CAD COMPARISON:  Previous exam(s). ACR Breast Density Category b: There are scattered areas of fibroglandular density. FINDINGS: There are no findings suspicious for malignancy. Images were processed with CAD. IMPRESSION: No mammographic evidence of malignancy. A result letter of this screening mammogram will be mailed directly to the patient. RECOMMENDATION: Screening mammogram in one year. (Code:SM-B-01Y) BI-RADS CATEGORY  1: Negative Electronically Signed   By: Edwin Cap M.D.   On: 09/05/2013 08:39     No results found.  No results found.    Assessment and Plan: Patient Active Problem List   Diagnosis Date Noted  . Family history of breast cancer 07/04/2018  . Flu vaccine need 07/04/2018  . Screening for breast cancer 03/12/2018  . Uncontrolled type 2 diabetes mellitus with hyperglycemia (HCC) 03/12/2018  . Mixed hyperlipidemia 03/12/2018  . Vitamin D deficiency 03/12/2018  . Dysuria 03/12/2018  . Need for prophylactic vaccination against diphtheria-tetanus-pertussis (DTP) 03/12/2018  . Hypertension 11/24/2017    1. Primary pulmonary HTN (HCC) Given the patients increase in Fatigue/SOB, a new echo is would be prudent.  Her last ECHO was 2 years ago.  - ECHOCARDIOGRAM COMPLETE; Future  2. Essential hypertension Stable on current therapy. Continue as directed.   3. Obesity (BMI 30-39.9) Obesity Counseling: Risk Assessment: An assessment of behavioral risk factors was made today and includes lack of exercise sedentary lifestyle, lack of portion control and poor dietary habits.  Risk Modification Advice: She was counseled on portion control  guidelines. Restricting daily caloric intake to.1500 . The detrimental long term effects of obesity on her health and ongoing poor compliance was also discussed with the patient.  4. Dyspnea, unspecified type - echocardiogram ordered  General Counseling: I have discussed the findings of the evaluation and examination with [facility].  I have also discussed any further diagnostic evaluation thatmay be needed or ordered today. Mariha verbalizes understanding of the findings of todays visit. We also reviewed her medications today and discussed drug interactions and side effects including but not limited excessive drowsiness and altered mental states. We also discussed that there is always a risk not just to her but also people around her. she has been encouraged to call the office with any questions or concerns that should arise related to todays visit.    Time spent: 25 This patient was seen by Blima Ledger AGNP-C in Collaboration with Dr. Freda Munro as a part of collaborative care agreement.   I have personally obtained a history, examined the patient, evaluated laboratory and imaging results, formulated the assessment and plan and placed orders.    Yevonne Pax, MD [facility] Pulmonary and Critical Care Sleep medicine

## 2018-07-21 ENCOUNTER — Ambulatory Visit: Payer: BLUE CROSS/BLUE SHIELD

## 2018-07-21 DIAGNOSIS — I272 Pulmonary hypertension, unspecified: Secondary | ICD-10-CM

## 2018-07-21 DIAGNOSIS — I270 Primary pulmonary hypertension: Secondary | ICD-10-CM

## 2018-08-01 ENCOUNTER — Other Ambulatory Visit: Payer: Self-pay | Admitting: Nurse Practitioner

## 2018-08-01 DIAGNOSIS — I10 Essential (primary) hypertension: Secondary | ICD-10-CM

## 2018-08-29 ENCOUNTER — Ambulatory Visit: Payer: Self-pay | Admitting: Adult Health

## 2018-08-29 ENCOUNTER — Other Ambulatory Visit: Payer: Self-pay

## 2018-08-29 DIAGNOSIS — I10 Essential (primary) hypertension: Secondary | ICD-10-CM

## 2018-08-29 MED ORDER — LISINOPRIL 40 MG PO TABS: ORAL_TABLET | ORAL | 6 refills | Status: DC

## 2018-08-30 ENCOUNTER — Encounter: Payer: Self-pay | Admitting: Adult Health

## 2018-08-30 ENCOUNTER — Ambulatory Visit: Payer: BLUE CROSS/BLUE SHIELD | Admitting: Adult Health

## 2018-08-30 VITALS — BP 161/92 | HR 88 | Resp 16 | Ht 64.5 in | Wt 229.0 lb

## 2018-08-30 DIAGNOSIS — R0600 Dyspnea, unspecified: Secondary | ICD-10-CM

## 2018-08-30 DIAGNOSIS — I10 Essential (primary) hypertension: Secondary | ICD-10-CM

## 2018-08-30 DIAGNOSIS — E669 Obesity, unspecified: Secondary | ICD-10-CM

## 2018-08-30 DIAGNOSIS — E782 Mixed hyperlipidemia: Secondary | ICD-10-CM | POA: Diagnosis not present

## 2018-08-30 DIAGNOSIS — I270 Primary pulmonary hypertension: Secondary | ICD-10-CM

## 2018-08-30 NOTE — Patient Instructions (Signed)

## 2018-08-30 NOTE — Progress Notes (Signed)
[facility] [address] [place], [place] [address]  Internal MEDICINE  Office Visit Note  Patient Name: Carol Lewis  [id number]  [id number]  Date of Service: 08/30/2018  Chief Complaint  Patient presents with  . Diabetes    follow up echo  . Hypertension    HPI  Pt is here for follow up on diabetes and htn.  Patient had echocardiogram performed on October 25 that shows normal left ventricular ejection fraction, mild tricuspid regurg, diastolic dysfunction, and mild pulmonary artery hypertension.  This is similar to the echo she had performed 2 years ago.  Patient has been taking lisinopril and hydrochlorothiazide for her hypertension.  It is of note that her pressure is elevated today 161/92.  Patient reports she has been taking her medication she intermittently takes her Lasix as she feels like she needs.  She states that her blood pressures typically okay but seems to be elevated when she is at the doctor's office.   Current Medication: Outpatient Encounter Medications as of 08/30/2018  Medication Sig  . ACCU-CHEK FASTCLIX LANCETS MISC Use once daily diag e11.65  . furosemide (LASIX) 20 MG tablet Take 1 tab po by daily for swelling  . glucose blood (ACCU-CHEK GUIDE) test strip 1 each by Other route daily. Use as instructed  . glyBURIDE-metformin (GLUCOVANCE) 2.5-500 MG tablet Take 1 tab by po in the morning and 2 tab in the evening  . hydrochlorothiazide (HYDRODIURIL) 25 MG tablet Take 1 tablet (25 mg total) by mouth daily.  Marland Kitchen lisinopril (PRINIVIL,ZESTRIL) 40 MG tablet TAKE 1 TABLET BY MOUTH DAILY FOR BLOOD PRESSURE   No facility-administered encounter medications on file as of 08/30/2018.     Surgical History: History reviewed. No pertinent surgical history.  Medical History: Past Medical History:  Diagnosis Date  . Diabetes mellitus without complication (HCC)   . Hypertension   . Pulmonary hypertension (HCC)     Family History: Family History   Problem Relation Age of Onset  . Breast cancer Mother   . Diabetes Mother   . Bladder Cancer Father     Social History   Socioeconomic History  . Marital status: Married    Spouse name: Not on file  . Number of children: Not on file  . Years of education: Not on file  . Highest education level: Not on file  Occupational History  . Not on file  Social Needs  . Financial resource strain: Not on file  . Food insecurity:    Worry: Not on file    Inability: Not on file  . Transportation needs:    Medical: Not on file    Non-medical: Not on file  Tobacco Use  . Smoking status: Never Smoker  . Smokeless tobacco: Never Used  Substance and Sexual Activity  . Alcohol use: No    Frequency: Never  . Drug use: No  . Sexual activity: Not on file  Lifestyle  . Physical activity:    Days per week: Not on file    Minutes per session: Not on file  . Stress: Not on file  Relationships  . Social connections:    Talks on phone: Not on file    Gets together: Not on file    Attends religious service: Not on file    Active member of club or organization: Not on file    Attends meetings of clubs or organizations: Not on file    Relationship status: Not on file  . Intimate partner violence:  Fear of current or ex partner: Not on file    Emotionally abused: Not on file    Physically abused: Not on file    Forced sexual activity: Not on file  Other Topics Concern  . Not on file  Social History Narrative  . Not on file      Review of Systems  Constitutional: Negative for chills, fatigue and unexpected weight change.  HENT: Negative for congestion, rhinorrhea, sneezing and sore throat.   Eyes: Negative for photophobia, pain and redness.  Respiratory: Negative for cough, chest tightness and shortness of breath.   Cardiovascular: Negative for chest pain and palpitations.  Gastrointestinal: Negative for abdominal pain, constipation, diarrhea, nausea and vomiting.  Endocrine:  Negative.   Genitourinary: Negative for dysuria and frequency.  Musculoskeletal: Negative for arthralgias, back pain, joint swelling and neck pain.  Skin: Negative for rash.  Allergic/Immunologic: Negative.   Neurological: Negative for tremors and numbness.  Hematological: Negative for adenopathy. Does not bruise/bleed easily.  Psychiatric/Behavioral: Negative for behavioral problems and sleep disturbance. The patient is not nervous/anxious.     Vital Signs: BP (!) 161/92 (BP Location: Left Arm, Patient Position: Sitting, Cuff Size: Normal)   Pulse 88   Resp 16   Ht 5' 4.5" (1.638 m)   Wt 229 lb (103.9 kg)   SpO2 98%   BMI 38.70 kg/m    Physical Exam  Constitutional: She is oriented to person, place, and time. She appears well-developed and well-nourished. No distress.  HENT:  Head: Normocephalic and atraumatic.  Mouth/Throat: Oropharynx is clear and moist. No oropharyngeal exudate.  Eyes: Pupils are equal, round, and reactive to light. EOM are normal.  Neck: Normal range of motion. Neck supple. No JVD present. No tracheal deviation present. No thyromegaly present.  Cardiovascular: Normal rate, regular rhythm and normal heart sounds. Exam reveals no gallop and no friction rub.  No murmur heard. Pulmonary/Chest: Effort normal and breath sounds normal. No respiratory distress. She has no wheezes. She has no rales. She exhibits no tenderness.  Abdominal: Soft. There is no tenderness. There is no guarding.  Musculoskeletal: Normal range of motion.  Lymphadenopathy:    She has no cervical adenopathy.  Neurological: She is alert and oriented to person, place, and time. No cranial nerve deficit.  Skin: Skin is warm and dry. She is not diaphoretic.  Psychiatric: She has a normal mood and affect. Her behavior is normal. Judgment and thought content normal.  Nursing note and vitals reviewed.  Assessment/Plan: 1. Primary pulmonary HTN (HCC) Patient's pulmonary hypertension remains mild  via her echocardiogram.  2. Obesity (BMI 30-39.9) Obesity Counseling: Risk Assessment: An assessment of behavioral risk factors was made today and includes lack of exercise sedentary lifestyle, lack of portion control and poor dietary habits.  Risk Modification Advice: She was counseled on portion control guidelines. Restricting daily caloric intake to. . The detrimental long term effects of obesity on her health and ongoing poor compliance was also discussed with the patient.  3. Dyspnea, unspecified type Patient reports intermittent dyspnea especially with exertion.  4. Mixed hyperlipidemia Stable, continue current therapy.  5. Essential hypertension We will continue to follow patient's blood pressure at future visits.  General Counseling: Velora MediateStephanie verbalizes understanding of the findings of todays visit and agrees with plan of treatment. I have discussed any further diagnostic evaluation that may be needed or ordered today. We also reviewed her medications today. she has been encouraged to call the office with any questions or concerns that should arise  related to todays visit.    No orders of the defined types were placed in this encounter.   No orders of the defined types were placed in this encounter.   Time spent: 25 Minutes   This patient was seen by Blima Ledger AGNP-C in Collaboration with Dr Lyndon Code as a part of collaborative care agreement     Johnna Acosta AGNP-C Internal medicine

## 2018-10-23 ENCOUNTER — Encounter: Payer: Self-pay | Admitting: Nurse Practitioner

## 2018-10-23 ENCOUNTER — Ambulatory Visit (INDEPENDENT_AMBULATORY_CARE_PROVIDER_SITE_OTHER): Payer: BLUE CROSS/BLUE SHIELD | Admitting: Nurse Practitioner

## 2018-10-23 VITALS — BP 150/90 | HR 62 | Resp 98 | Ht 64.0 in | Wt 225.0 lb

## 2018-10-23 DIAGNOSIS — I10 Essential (primary) hypertension: Secondary | ICD-10-CM

## 2018-10-23 DIAGNOSIS — E1165 Type 2 diabetes mellitus with hyperglycemia: Secondary | ICD-10-CM

## 2018-10-23 DIAGNOSIS — Z1239 Encounter for other screening for malignant neoplasm of breast: Secondary | ICD-10-CM

## 2018-10-23 DIAGNOSIS — I270 Primary pulmonary hypertension: Secondary | ICD-10-CM | POA: Diagnosis not present

## 2018-10-23 LAB — POCT GLYCOSYLATED HEMOGLOBIN (HGB A1C): HEMOGLOBIN A1C: 7.3 % — AB (ref 4.0–5.6)

## 2018-10-23 MED ORDER — GLYBURIDE-METFORMIN 2.5-500 MG PO TABS: ORAL_TABLET | ORAL | 5 refills | Status: DC

## 2018-10-23 NOTE — Progress Notes (Signed)
[facility] [address] [place], [place] [address]  Internal MEDICINE  Office Visit Note  Patient Name: Carol SeltzerStephanie Lewis  [id number] 20, 2072  [id number]  Date of Service: 11/01/2018  Chief Complaint  Patient presents with  . Hypertension  . Diabetes    Patient arrived to primary care for DM follow up. The previous A1C was 7.3. is still 7.3 today.   Her morning blood sugars are in 130-160 range in the past weeks. She denies any signs and symptoms of hypoglycemia except when she gets very busy in the morning and forgets to eat. She describes the feeling "being nauseous" that resolves very quickly after snack. No other episodes of hypoglycemia or hyperglycemia were reported during this visit. Otherwise, patient does not have any current concerns. Blood pressure is generally well controlled. She states her pressure has been elevated some recently. 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 This is likely contributing to new elevation in blood pressure. States that when monitoring her pressure at home, it is generally in 140s over 80s. She has no chest pain, pressure, headache, shortness of breath, or dependant edema.       Current Medication: Outpatient Encounter Medications as of 10/23/2018  Medication Sig  . ACCU-CHEK FASTCLIX LANCETS MISC Use once daily diag e11.65  . furosemide (LASIX) 20 MG tablet Take 1 tab po by daily for swelling  . glucose blood (ACCU-CHEK GUIDE) test strip 1 each by Other route daily. Use as instructed  . glyBURIDE-metformin (GLUCOVANCE) 2.5-500 MG tablet Take 1 tab by po in the morning and 2 tab in the evening  . hydrochlorothiazide (HYDRODIURIL) 25 MG tablet Take 1 tablet (25 mg total) by mouth daily.  Marland Kitchen. lisinopril (PRINIVIL,ZESTRIL) 40 MG tablet TAKE 1 TABLET BY MOUTH DAILY FOR BLOOD PRESSURE  . [DISCONTINUED] glyBURIDE-metformin (GLUCOVANCE) 2.5-500 MG tablet Take 1 tab by po in the morning and 2 tab in the evening   No  facility-administered encounter medications on file as of 10/23/2018.     Surgical History: History reviewed. No pertinent surgical history.  Medical History: Past Medical History:  Diagnosis Date  . Diabetes mellitus without complication (HCC)   . Hypertension   . Pulmonary hypertension (HCC)     Family History: Family History  Problem Relation Age of Onset  . Breast cancer Mother   . Diabetes Mother   . Bladder Cancer Father     Social History   Socioeconomic History  . Marital status: Married    Spouse name: Not on file  . Number of children: Not on file  . Years of education: Not on file  . Highest education level: Not on file  Occupational History  . Not on file  Social Needs  . Financial resource strain: Not on file  . Food insecurity:    Worry: Not on file    Inability: Not on file  . Transportation needs:    Medical: Not on file    Non-medical: Not on file  Tobacco Use  . Smoking status: Never Smoker  . Smokeless tobacco: Never Used  Substance and Sexual Activity  . Alcohol use: No    Frequency: Never  . Drug use: No  . Sexual activity: Not on file  Lifestyle  . Physical activity:    Days per week: Not on file    Minutes per session: Not on file  . Stress: Not on file  Relationships  . Social connections:    Talks on phone: Not on file  Gets together: Not on file    Attends religious service: Not on file    Active member of club or organization: Not on file    Attends meetings of clubs or organizations: Not on file    Relationship status: Not on file  . Intimate partner violence:    Fear of current or ex partner: Not on file    Emotionally abused: Not on file    Physically abused: Not on file    Forced sexual activity: Not on file  Other Topics Concern  . Not on file  Social History Narrative  . Not on file      Review of Systems  Constitutional: Positive for fatigue. Negative for activity change, appetite change, chills and fever.   HENT: Negative for congestion, ear pain, facial swelling, hearing loss, rhinorrhea, sinus pain and sore throat.   Respiratory: Negative for apnea, cough, chest tightness, shortness of breath and wheezing.   Cardiovascular: Negative for chest pain and palpitations.       Elevated blood pressure recently.   Gastrointestinal: Negative for abdominal distention, abdominal pain, anal bleeding, constipation, diarrhea, nausea and vomiting.  Endocrine: Negative for cold intolerance, heat intolerance, polyphagia and polyuria.       Stable blood sugars .  Musculoskeletal: Negative for arthralgias, back pain and myalgias.  Skin: Negative for color change and rash.  Allergic/Immunologic: Negative for environmental allergies.  Neurological: Negative for dizziness, tremors, facial asymmetry, weakness, numbness and headaches.  Hematological: Negative for adenopathy.  Psychiatric/Behavioral: Negative for behavioral problems and dysphoric mood. The patient is not nervous/anxious.     Today's Vitals   10/23/18 1509  BP: (!) 150/90  Pulse: 62  Resp: (!) 98  Weight: 225 lb (102.1 kg)  Height: 5\' 4"  (1.626 m)   Body mass index is 38.62 kg/m.  Physical Exam Vitals signs and nursing note reviewed.  Constitutional:      Appearance: Normal appearance. She is well-developed. She is obese.  HENT:     Head: Normocephalic and atraumatic.     Nose: Nose normal.  Eyes:     Conjunctiva/sclera: Conjunctivae normal.     Pupils: Pupils are equal, round, and reactive to light.  Neck:     Musculoskeletal: Normal range of motion and neck supple.     Thyroid: No thyromegaly.     Vascular: No carotid bruit.  Cardiovascular:     Rate and Rhythm: Normal rate and regular rhythm.     Heart sounds: Normal heart sounds.  Pulmonary:     Effort: Pulmonary effort is normal.     Breath sounds: Normal breath sounds. No wheezing.  Abdominal:     General: Bowel sounds are normal.     Palpations: Abdomen is soft.      Tenderness: There is no abdominal tenderness.  Musculoskeletal: Normal range of motion.  Lymphadenopathy:     Cervical: No cervical adenopathy.  Skin:    General: Skin is warm and dry.  Neurological:     Mental Status: She is alert and oriented to person, place, and time.  Psychiatric:        Behavior: Behavior normal.        Thought Content: Thought content normal.        Judgment: Judgment normal.    Assessment/Plan: 1. Uncontrolled type 2 diabetes mellitus with hyperglycemia (HCC) - POCT HgB A1C 7.3 today. Continue diabetic medication as prescribed. Reviewed importance of following ADA diet and regular exercise on blood sugar control.  - glyBURIDE-metformin (GLUCOVANCE) 2.5-500  MG tablet; Take 1 tab by po in the morning and 2 tab in the evening  Dispense: 90 tablet; Refill: 5  2. Essential hypertension Generally stable. Continue medication as prescribed. Continue to monitor closely at home. Notify office if continues to run high over next few weeks.   3. Primary pulmonary HTN (HCC) Continue regular visits with Dr. Freda Munro   4. Screening for breast cancer Mammogram to be scheduled.   General Counseling: hitomi lyden understanding of the findings of todays visit and agrees with plan of treatment. I have discussed any further diagnostic evaluation that may be needed or ordered today. We also reviewed her medications today. she has been encouraged to call the office with any questions or concerns that should arise related to todays visit.  Hypertension Counseling:   The following hypertensive lifestyle modification were recommended and discussed:  1. Limiting alcohol intake to less than 1 oz/day of ethanol:(24 oz of beer or 8 oz of wine or 2 oz of 100-proof whiskey). 2. Take baby ASA 81 mg daily. 3. Importance of regular aerobic exercise and losing weight. 4. Reduce dietary saturated fat and cholesterol intake for overall cardiovascular health. 5. Maintaining adequate  dietary potassium, calcium, and magnesium intake. 6. Regular monitoring of the blood pressure. 7. Reduce sodium intake to less than 100 mmol/day (less than 2.3 gm of sodium or less than 6 gm of sodium choride)   Diabetes Counseling:  1. Addition of ACE inh/ ARB'S for nephroprotection. Microalbumin is updated  2. Diabetic foot care, prevention of complications. Podiatry consult 3. Exercise and lose weight.  4. Diabetic eye examination, Diabetic eye exam is updated  5. Monitor blood sugar closlely. nutrition counseling.  6. Sign and symptoms of hypoglycemia including shaking sweating,confusion and headaches.  This patient was seen by Vincent Gros FNP Collaboration with Dr Lyndon Code as a part of collaborative care agreement  Orders Placed This Encounter  Procedures  . POCT HgB A1C    Meds ordered this encounter  Medications  . glyBURIDE-metformin (GLUCOVANCE) 2.5-500 MG tablet    Sig: Take 1 tab by po in the morning and 2 tab in the evening    Dispense:  90 tablet    Refill:  5    Order Specific Question:   Supervising Provider    Answer:   Lyndon Code [[id number]]    Time spent: 44 Minutes      Dr Lyndon Code Internal medicine

## 2018-11-01 DIAGNOSIS — I270 Primary pulmonary hypertension: Secondary | ICD-10-CM | POA: Insufficient documentation

## 2019-01-08 ENCOUNTER — Other Ambulatory Visit: Payer: Self-pay

## 2019-01-08 DIAGNOSIS — I10 Essential (primary) hypertension: Secondary | ICD-10-CM

## 2019-01-08 MED ORDER — HYDROCHLOROTHIAZIDE 25 MG PO TABS: 25.0000 mg | ORAL_TABLET | Freq: Every day | ORAL | 5 refills | Status: DC

## 2019-02-22 ENCOUNTER — Ambulatory Visit (INDEPENDENT_AMBULATORY_CARE_PROVIDER_SITE_OTHER): Payer: BLUE CROSS/BLUE SHIELD | Admitting: Nurse Practitioner

## 2019-02-22 ENCOUNTER — Encounter: Payer: Self-pay | Admitting: Nurse Practitioner

## 2019-02-22 ENCOUNTER — Other Ambulatory Visit: Payer: Self-pay

## 2019-02-22 VITALS — BP 165/103 | HR 81 | Resp 16 | Ht 60.0 in | Wt 217.6 lb

## 2019-02-22 DIAGNOSIS — Z124 Encounter for screening for malignant neoplasm of cervix: Secondary | ICD-10-CM

## 2019-02-22 DIAGNOSIS — E1165 Type 2 diabetes mellitus with hyperglycemia: Secondary | ICD-10-CM | POA: Diagnosis not present

## 2019-02-22 DIAGNOSIS — I10 Essential (primary) hypertension: Secondary | ICD-10-CM | POA: Diagnosis not present

## 2019-02-22 DIAGNOSIS — Z0001 Encounter for general adult medical examination with abnormal findings: Secondary | ICD-10-CM | POA: Diagnosis not present

## 2019-02-22 DIAGNOSIS — R300 Dysuria: Secondary | ICD-10-CM

## 2019-02-22 DIAGNOSIS — Z1239 Encounter for other screening for malignant neoplasm of breast: Secondary | ICD-10-CM

## 2019-02-22 LAB — POCT GLYCOSYLATED HEMOGLOBIN (HGB A1C): Hemoglobin A1C: 6.7 % — AB (ref 4.0–5.6)

## 2019-02-22 MED ORDER — LISINOPRIL 40 MG PO TABS: ORAL_TABLET | ORAL | 6 refills | Status: DC

## 2019-02-22 NOTE — Progress Notes (Signed)
[facility] [address] [place], [place] [address]  Internal MEDICINE  Office Visit Note  Patient Name: Carol Lewis  [id number]  [id number]  Date of Service: 03/05/2019   Pt is here for routine health maintenance examination  Chief Complaint  Patient presents with  . Annual Exam  . Diabetes  . Hypertension  . Hyperlipidemia     The patient is here for routine health maintenance exam and pap smear. Blood sugars are doing well. HgbA1c has come down to 6.7. blood pressure is elevated today, however, it is generally well controlled. She states that she is feeling well and has no physical concerns or complaints to discuss. She is due to have screening mammogram. She will be due to have her routine, fasting labs done prior to her next visit.     Current Medication: Outpatient Encounter Medications as of 02/22/2019  Medication Sig  . ACCU-CHEK FASTCLIX LANCETS MISC Use once daily diag e11.65  . furosemide (LASIX) 20 MG tablet Take 1 tab po by daily for swelling  . glucose blood (ACCU-CHEK GUIDE) test strip 1 each by Other route daily. Use as instructed  . glyBURIDE-metformin (GLUCOVANCE) 2.5-500 MG tablet Take 1 tab by po in the morning and 2 tab in the evening  . hydrochlorothiazide (HYDRODIURIL) 25 MG tablet Take 1 tablet (25 mg total) by mouth daily.  Marland Kitchen lisinopril (ZESTRIL) 40 MG tablet TAKE 1 TABLET BY MOUTH DAILY FOR BLOOD PRESSURE  . [DISCONTINUED] lisinopril (PRINIVIL,ZESTRIL) 40 MG tablet TAKE 1 TABLET BY MOUTH DAILY FOR BLOOD PRESSURE   No facility-administered encounter medications on file as of 02/22/2019.     Surgical History: History reviewed. No pertinent surgical history.  Medical History: Past Medical History:  Diagnosis Date  . Diabetes mellitus without complication (HCC)   . Hypertension   . Pulmonary hypertension (HCC)     Family History: Family History  Problem Relation Age of Onset  . Breast cancer Mother   . Diabetes Mother   .  Bladder Cancer Father       Review of Systems  Constitutional: Negative for activity change, appetite change, chills, fatigue and fever.  HENT: Negative for congestion, ear pain, facial swelling, hearing loss, rhinorrhea, sinus pain and sore throat.   Respiratory: Negative for apnea, cough, chest tightness, shortness of breath and wheezing.   Cardiovascular: Negative for chest pain and palpitations.       Elevated blood pressure recently.   Gastrointestinal: Negative for abdominal distention, abdominal pain, anal bleeding, constipation, diarrhea, nausea and vomiting.  Endocrine: Negative for cold intolerance, heat intolerance, polydipsia and polyuria.       Stable blood sugars .  Genitourinary: Negative for dysuria, frequency, pelvic pain and urgency.  Musculoskeletal: Negative for arthralgias, back pain and myalgias.  Skin: Negative for color change and rash.  Allergic/Immunologic: Negative for environmental allergies.  Neurological: Negative for dizziness, tremors, facial asymmetry, weakness, numbness and headaches.  Hematological: Negative for adenopathy.  Psychiatric/Behavioral: Negative for behavioral problems and dysphoric mood. The patient is not nervous/anxious.     Today's Vitals   02/22/19 1507  BP: (!) 165/103  Pulse: 81  Resp: 16  SpO2: 97%  Weight: 217 lb 9.6 oz (98.7 kg)  Height: 5' (1.524 m)   Body mass index is 42.5 kg/m.  Physical Exam Vitals signs and nursing note reviewed.  Constitutional:      Appearance: Normal appearance. She is well-developed. She is obese.  HENT:     Head: Normocephalic and atraumatic.  Nose: Nose normal.  Eyes:     Conjunctiva/sclera: Conjunctivae normal.     Pupils: Pupils are equal, round, and reactive to light.  Neck:     Musculoskeletal: Normal range of motion and neck supple.     Thyroid: No thyromegaly.     Vascular: No carotid bruit.  Cardiovascular:     Rate and Rhythm: Normal rate and regular rhythm.     Pulses:  Normal pulses.          Dorsalis pedis pulses are 2+ on the right side and 2+ on the left side.       Posterior tibial pulses are 2+ on the right side and 2+ on the left side.     Heart sounds: Normal heart sounds.  Pulmonary:     Effort: Pulmonary effort is normal.     Breath sounds: Normal breath sounds. No wheezing.  Chest:     Breasts:        Right: Normal. No swelling, bleeding, inverted nipple, mass, nipple discharge, skin change or tenderness.        Left: Normal. No swelling, bleeding, inverted nipple, mass, nipple discharge, skin change or tenderness.  Abdominal:     General: Bowel sounds are normal.     Palpations: Abdomen is soft.     Tenderness: There is no abdominal tenderness.     Hernia: There is no hernia in the right inguinal area or left inguinal area.  Genitourinary:    Exam position: Supine.     Labia:        Right: No rash or tenderness.        Left: No rash or tenderness.      Vagina: Normal. No vaginal discharge, erythema or tenderness.     Cervix: No cervical motion tenderness, discharge or erythema.     Uterus: Normal.      Adnexa: Right adnexa normal and left adnexa normal.     Comments: No tenderness, masses, or organomeglay present during bimanual exam . Musculoskeletal:     Right foot: Normal range of motion. No deformity.     Left foot: Normal range of motion. No deformity.  Feet:     Right foot:     Protective Sensation: 10 sites tested. 10 sites sensed.     Skin integrity: Skin integrity normal.     Toenail Condition: Right toenails are normal.     Left foot:     Protective Sensation: 10 sites tested. 10 sites sensed.     Skin integrity: Skin integrity normal.     Toenail Condition: Left toenails are normal.  Lymphadenopathy:     Cervical: No cervical adenopathy.     Lower Body: No right inguinal adenopathy. No left inguinal adenopathy.  Skin:    General: Skin is warm and dry.  Neurological:     Mental Status: She is alert and oriented to  person, place, and time.  Psychiatric:        Behavior: Behavior normal.        Thought Content: Thought content normal.        Judgment: Judgment normal.      LABS: Recent Results (from the past 2160 hour(s))  UA/M w/rflx Culture, Routine     Status: Abnormal   Collection Time: 02/22/19  3:16 PM  Result Value Ref Range   Specific Gravity, UA 1.005 1.005 - 1.030   pH, UA 7.0 5.0 - 7.5   Color, UA Yellow Yellow   Appearance Ur Clear Clear  Leukocytes,UA Negative Negative   Protein,UA Negative Negative/Trace   Glucose, UA Negative Negative   Ketones, UA Negative Negative   RBC, UA Trace (A) Negative   Bilirubin, UA Negative Negative   Urobilinogen, Ur 0.2 0.2 - 1.0 mg/dL   Nitrite, UA Negative Negative   Microscopic Examination See below:     Comment: Microscopic was indicated and was performed.   Urinalysis Reflex Comment     Comment: This specimen will not reflex to a Urine Culture.  Microscopic Examination     Status: None   Collection Time: 02/22/19  3:16 PM  Result Value Ref Range   WBC, UA 0-5 0 - 5 /hpf   RBC 0-2 0 - 2 /hpf   Epithelial Cells (non renal) 0-10 0 - 10 /hpf   Casts None seen None seen /lpf   Bacteria, UA None seen None seen/Few  Pap IG and HPV (high risk) DNA detection     Status: None   Collection Time: 02/22/19  3:17 PM  Result Value Ref Range   Interpretation NILM     Comment: NEGATIVE FOR INTRAEPITHELIAL LESION OR MALIGNANCY.   Category NIL     Comment: Negative for Intraepithelial Lesion   Adequacy ENDO     Comment: Satisfactory for evaluation. Endocervical and/or squamous metaplastic cells (endocervical component) are present.    Clinician Provided ICD10 Comment     Comment: Z12.4   Performed by: Comment     Comment: Phillis HaggisAdam Daniels, Cytotechnologist (ASCP)   Note: Comment     Comment: The Pap smear is a screening test designed to aid in the detection of premalignant and malignant conditions of the uterine cervix.  It is not a diagnostic  procedure and should not be used as the sole means of detecting cervical cancer.  Both false-positive and false-negative reports do occur.    Test Methodology Comment     Comment: This liquid based ThinPrep(R) pap test was screened with the use of an image guided system.    HPV, high-risk Negative Negative    Comment: This nucleic acid amplification high-risk HPV test detects thirteen high-risk types (16,18,31,33,35,39,45,51,52,56,58,59,68) without differentiation.   POCT HgB A1C     Status: Abnormal   Collection Time: 02/22/19  3:21 PM  Result Value Ref Range   Hemoglobin A1C 6.7 (A) 4.0 - 5.6 %   HbA1c POC (<> result, manual entry)     HbA1c, POC (prediabetic range)     HbA1c, POC (controlled diabetic range)      Assessment/Plan: 1. Encounter for general adult medical examination with abnormal findings Annual health maintenance exam with pap smear today.  2. Uncontrolled type 2 diabetes mellitus with hyperglycemia (HCC) - POCT HgB A1C 6.7 today. Continue all diabetic medication as prescribed. Follow diabetic diet and limit intake of carbohydrates and sugar in the diet.   3. Essential hypertension Though elevated today, blood pressure is generally well controlled. Continue meds as prescribed. Will monitor closely at home. If continues to be elevated, will adjust bp medication prior to next visit.  - lisinopril (ZESTRIL) 40 MG tablet; TAKE 1 TABLET BY MOUTH DAILY FOR BLOOD PRESSURE  Dispense: 30 tablet; Refill: 6  4. Routine cervical smear - Pap IG and HPV (high risk) DNA detection  5. Screening for breast cancer - MM DIGITAL SCREENING BILATERAL; Future  6. Dysuria - UA/M w/rflx Culture, Routine  General Counseling: Carol Lewis verbalizes understanding of the findings of todays visit and agrees with plan of treatment. I have discussed any further diagnostic  evaluation that may be needed or ordered today. We also reviewed her medications today. she has been encouraged to call  the office with any questions or concerns that should arise related to todays visit.    Counseling:  Hypertension Counseling:   The following hypertensive lifestyle modification were recommended and discussed:  1. Limiting alcohol intake to less than 1 oz/day of ethanol:(24 oz of beer or 8 oz of wine or 2 oz of 100-proof whiskey). 2. Take baby ASA 81 mg daily. 3. Importance of regular aerobic exercise and losing weight. 4. Reduce dietary saturated fat and cholesterol intake for overall cardiovascular health. 5. Maintaining adequate dietary potassium, calcium, and magnesium intake. 6. Regular monitoring of the blood pressure. 7. Reduce sodium intake to less than 100 mmol/day (less than 2.3 gm of sodium or less than 6 gm of sodium choride)   This patient was seen by Vincent Gros FNP Collaboration with Dr Lyndon Code as a part of collaborative care agreement  Orders Placed This Encounter  Procedures  . Microscopic Examination  . MM DIGITAL SCREENING BILATERAL  . UA/M w/rflx Culture, Routine  . POCT HgB A1C    Meds ordered this encounter  Medications  . lisinopril (ZESTRIL) 40 MG tablet    Sig: TAKE 1 TABLET BY MOUTH DAILY FOR BLOOD PRESSURE    Dispense:  30 tablet    Refill:  6    Order Specific Question:   Supervising Provider    Answer:   Lyndon Code [[id number]]    Time spent: 59 Minutes      Lyndon Code, MD  Internal Medicine

## 2019-02-23 LAB — UA/M W/RFLX CULTURE, ROUTINE
Bilirubin, UA: NEGATIVE
Glucose, UA: NEGATIVE
Ketones, UA: NEGATIVE
Leukocytes,UA: NEGATIVE
Nitrite, UA: NEGATIVE
Protein,UA: NEGATIVE
Specific Gravity, UA: 1.005 (ref 1.005–1.030)
Urobilinogen, Ur: 0.2 mg/dL (ref 0.2–1.0)
pH, UA: 7 (ref 5.0–7.5)

## 2019-02-23 LAB — MICROSCOPIC EXAMINATION
Bacteria, UA: NONE SEEN
Casts: NONE SEEN /lpf

## 2019-02-28 LAB — PAP IG AND HPV HIGH-RISK: HPV, high-risk: NEGATIVE

## 2019-04-09 ENCOUNTER — Other Ambulatory Visit: Payer: Self-pay

## 2019-04-09 DIAGNOSIS — E1165 Type 2 diabetes mellitus with hyperglycemia: Secondary | ICD-10-CM

## 2019-04-09 MED ORDER — GLYBURIDE-METFORMIN 2.5-500 MG PO TABS: ORAL_TABLET | ORAL | 5 refills | Status: DC

## 2019-06-22 ENCOUNTER — Other Ambulatory Visit: Payer: Self-pay

## 2019-06-22 DIAGNOSIS — I10 Essential (primary) hypertension: Secondary | ICD-10-CM

## 2019-06-22 MED ORDER — HYDROCHLOROTHIAZIDE 25 MG PO TABS: 25.0000 mg | ORAL_TABLET | Freq: Every day | ORAL | 5 refills | Status: DC

## 2019-06-28 ENCOUNTER — Other Ambulatory Visit: Payer: Self-pay

## 2019-06-28 ENCOUNTER — Ambulatory Visit: Payer: BLUE CROSS/BLUE SHIELD | Admitting: Nurse Practitioner

## 2019-06-28 ENCOUNTER — Encounter: Payer: Self-pay | Admitting: Nurse Practitioner

## 2019-06-28 VITALS — BP 150/86 | HR 79 | Temp 97.3°F | Resp 16 | Ht 64.0 in | Wt 219.0 lb

## 2019-06-28 DIAGNOSIS — E1165 Type 2 diabetes mellitus with hyperglycemia: Secondary | ICD-10-CM | POA: Diagnosis not present

## 2019-06-28 DIAGNOSIS — E782 Mixed hyperlipidemia: Secondary | ICD-10-CM | POA: Diagnosis not present

## 2019-06-28 DIAGNOSIS — I10 Essential (primary) hypertension: Secondary | ICD-10-CM | POA: Diagnosis not present

## 2019-06-28 LAB — POCT GLYCOSYLATED HEMOGLOBIN (HGB A1C): Hemoglobin A1C: 6.7 % — AB (ref 4.0–5.6)

## 2019-06-28 NOTE — Progress Notes (Signed)
[facility] [place], [place] [address]  Internal MEDICINE  Office Visit Note  Patient Name: Carol Lewis  [id number]  [id number]  Date of Service: 07/08/2019  Chief Complaint  Patient presents with  . Diabetes  . Hypertension  . Quality Metric Gaps    diabetic eye exam    The patient is here for follow up of diabetes. Well controlled. HgbA1c is 6.7. Blood pressure is slightly elevated. States that it always is when she is in the office. Monitoring it at home shows blood pressures in the 120s over 80s. She states that she feels well and has no concerns or complaints.       Current Medication: Outpatient Encounter Medications as of 06/28/2019  Medication Sig  . ACCU-CHEK FASTCLIX LANCETS MISC Use once daily diag e11.65  . furosemide (LASIX) 20 MG tablet Take 1 tab po by daily for swelling  . glucose blood (ACCU-CHEK GUIDE) test strip 1 each by Other route daily. Use as instructed  . glyBURIDE-metformin (GLUCOVANCE) 2.5-500 MG tablet Take 1 tab by po in the morning and 2 tab in the evening  . hydrochlorothiazide (HYDRODIURIL) 25 MG tablet Take 1 tablet (25 mg total) by mouth daily.  Marland Kitchen lisinopril (ZESTRIL) 40 MG tablet TAKE 1 TABLET BY MOUTH DAILY FOR BLOOD PRESSURE   No facility-administered encounter medications on file as of 06/28/2019.     Surgical History: History reviewed. No pertinent surgical history.  Medical History: Past Medical History:  Diagnosis Date  . Diabetes mellitus without complication ([place])   . Hypertension   . Pulmonary hypertension (HCC)     Family History: Family History  Problem Relation Age of Onset  . Breast cancer Mother   . Diabetes Mother   . Bladder Cancer Father     Social History   Socioeconomic History  . Marital status: Married    Spouse name: Not on file  . Number of children: Not on file  . Years of education: Not on file  . Highest education level: Not on file  Occupational History  . Not on  file  Social Needs  . Financial resource strain: Not on file  . Food insecurity    Worry: Not on file    Inability: Not on file  . Transportation needs    Medical: Not on file    Non-medical: Not on file  Tobacco Use  . Smoking status: Never Smoker  . Smokeless tobacco: Never Used  Substance and Sexual Activity  . Alcohol use: No    Frequency: Never  . Drug use: No  . Sexual activity: Not on file  Lifestyle  . Physical activity    Days per week: Not on file    Minutes per session: Not on file  . Stress: Not on file  Relationships  . Social Herbalist on phone: Not on file    Gets together: Not on file    Attends religious service: Not on file    Active member of club or organization: Not on file    Attends meetings of clubs or organizations: Not on file    Relationship status: Not on file  . Intimate partner violence    Fear of current or ex partner: Not on file    Emotionally abused: Not on file    Physically abused: Not on file    Forced sexual activity: Not on file  Other Topics Concern  . Not on file  Social History Narrative  .  Not on file      Review of Systems  Constitutional: Negative for activity change, appetite change, chills, fatigue and fever.  HENT: Negative for congestion, ear pain, facial swelling, hearing loss, rhinorrhea, sinus pain and sore throat.   Respiratory: Negative for apnea, cough, chest tightness, shortness of breath and wheezing.   Cardiovascular: Negative for chest pain and palpitations.       Stable blood pressure  Gastrointestinal: Negative for abdominal distention, abdominal pain, anal bleeding, constipation, diarrhea, nausea and vomiting.  Endocrine: Negative for cold intolerance, heat intolerance, polyphagia and polyuria.       Stable blood sugars .  Musculoskeletal: Negative for arthralgias, back pain and myalgias.  Skin: Negative for color change and rash.  Allergic/Immunologic: Negative for environmental allergies.   Neurological: Negative for dizziness, tremors, facial asymmetry, weakness, numbness and headaches.  Hematological: Negative for adenopathy.  Psychiatric/Behavioral: Negative for behavioral problems and dysphoric mood. The patient is not nervous/anxious.    Today's Vitals   06/28/19 1501  BP: (!) 150/86  Pulse: 79  Resp: 16  Temp: (!) 97.3 F (36.3 C)  SpO2: 99%  Weight: 219 lb (99.3 kg)  Height: 5\' 4"  (1.626 m)   Body mass index is 37.59 kg/m.  Physical Exam Vitals signs and nursing note reviewed.  Constitutional:      Appearance: Normal appearance. She is well-developed. She is obese.  HENT:     Head: Normocephalic and atraumatic.     Nose: Nose normal.  Eyes:     Conjunctiva/sclera: Conjunctivae normal.     Pupils: Pupils are equal, round, and reactive to light.  Neck:     Musculoskeletal: Normal range of motion and neck supple.     Thyroid: No thyromegaly.     Vascular: No carotid bruit.  Cardiovascular:     Rate and Rhythm: Normal rate and regular rhythm.     Heart sounds: Normal heart sounds.  Pulmonary:     Effort: Pulmonary effort is normal.     Breath sounds: Normal breath sounds. No wheezing.  Abdominal:     Palpations: Abdomen is soft.  Musculoskeletal: Normal range of motion.  Lymphadenopathy:     Cervical: No cervical adenopathy.  Skin:    General: Skin is warm and dry.  Neurological:     Mental Status: She is alert and oriented to person, place, and time.  Psychiatric:        Behavior: Behavior normal.        Thought Content: Thought content normal.        Judgment: Judgment normal.   Assessment/Plan: 1. Type 2 diabetes mellitus with hyperglycemia, unspecified whether long term insulin use (HCC) - POCT HgB A1C 6.7 today. Continue diabetic medication as prescribed. Refer for diabetic eye exam.  - Ambulatory referral to Ophthalmology  2. Essential hypertension Stable. continut bp medication.  3. Mixed hyperlipidemia Stable.   General  Counseling: Velora MediateStephanie verbalizes understanding of the findings of todays visit and agrees with plan of treatment. I have discussed any further diagnostic evaluation that may be needed or ordered today. We also reviewed her medications today. she has been encouraged to call the office with any questions or concerns that should arise related to todays visit.  Diabetes Counseling:  1. Addition of ACE inh/ ARB'S for nephroprotection. Microalbumin is updated  2. Diabetic foot care, prevention of complications. Podiatry consult 3. Exercise and lose weight.  4. Diabetic eye examination, Diabetic eye exam is updated  5. Monitor blood sugar closlely. nutrition counseling.  6. Sign and symptoms of hypoglycemia including shaking sweating,confusion and headaches.  This patient was seen by Vincent Gros FNP Collaboration with Dr Lyndon Code as a part of collaborative care agreement  Orders Placed This Encounter  Procedures  . Ambulatory referral to Ophthalmology  . POCT HgB A1C     Time spent: 25 Minutes      Dr Lyndon Code Internal medicine

## 2019-07-08 DIAGNOSIS — E1165 Type 2 diabetes mellitus with hyperglycemia: Secondary | ICD-10-CM | POA: Insufficient documentation

## 2019-07-10 ENCOUNTER — Ambulatory Visit: Payer: BLUE CROSS/BLUE SHIELD | Admitting: Internal Medicine

## 2019-07-10 ENCOUNTER — Other Ambulatory Visit: Payer: Self-pay

## 2019-07-10 ENCOUNTER — Encounter: Payer: Self-pay | Admitting: Internal Medicine

## 2019-07-10 VITALS — BP 130/90 | HR 78 | Temp 97.7°F | Resp 16 | Ht 64.0 in | Wt 220.0 lb

## 2019-07-10 DIAGNOSIS — I270 Primary pulmonary hypertension: Secondary | ICD-10-CM

## 2019-07-10 DIAGNOSIS — I10 Essential (primary) hypertension: Secondary | ICD-10-CM

## 2019-07-10 DIAGNOSIS — E669 Obesity, unspecified: Secondary | ICD-10-CM | POA: Diagnosis not present

## 2019-07-10 NOTE — Progress Notes (Signed)
[facility] [address] [place], [place] [address]  Pulmonary Sleep Medicine   Office Visit Note  Patient Name: Carol Lewis DOB: 1971-04-06 MRN [id number]  Date of Service: 07/10/2019  Complaints/HPI: Pt is here for pulmonary follow up. She reports she is doing well.  Her bp is borderline elevated today, she reports when taking it at home she is always WNL, it is only high here in the office.     ROS  General: (-) fever, (-) chills, (-) night sweats, (-) weakness Skin: (-) rashes, (-) itching,. Eyes: (-) visual changes, (-) redness, (-) itching. Nose and Sinuses: (-) nasal stuffiness or itchiness, (-) postnasal drip, (-) nosebleeds, (-) sinus trouble. Mouth and Throat: (-) sore throat, (-) hoarseness. Neck: (-) swollen glands, (-) enlarged thyroid, (-) neck pain. Respiratory: - cough, (-) bloody sputum, - shortness of breath, - wheezing. Cardiovascular: - ankle swelling, (-) chest pain. Lymphatic: (-) lymph node enlargement. Neurologic: (-) numbness, (-) tingling. Psychiatric: (-) anxiety, (-) depression   Current Medication: Outpatient Encounter Medications as of 07/10/2019  Medication Sig  . ACCU-CHEK FASTCLIX LANCETS MISC Use once daily diag e11.65  . furosemide (LASIX) 20 MG tablet Take 1 tab po by daily for swelling  . glucose blood (ACCU-CHEK GUIDE) test strip 1 each by Other route daily. Use as instructed  . glyBURIDE-metformin (GLUCOVANCE) 2.5-500 MG tablet Take 1 tab by po in the morning and 2 tab in the evening  . hydrochlorothiazide (HYDRODIURIL) 25 MG tablet Take 1 tablet (25 mg total) by mouth daily.  Marland Kitchen lisinopril (ZESTRIL) 40 MG tablet TAKE 1 TABLET BY MOUTH DAILY FOR BLOOD PRESSURE   No facility-administered encounter medications on file as of 07/10/2019.     Surgical History: History reviewed. No pertinent surgical history.  Medical History: Past Medical History:  Diagnosis Date  . Diabetes mellitus without complication (HCC)   .  Hypertension   . Pulmonary hypertension (HCC)     Family History: Family History  Problem Relation Age of Onset  . Breast cancer Mother   . Diabetes Mother   . Bladder Cancer Father     Social History: Social History   Socioeconomic History  . Marital status: Married    Spouse name: Not on file  . Number of children: Not on file  . Years of education: Not on file  . Highest education level: Not on file  Occupational History  . Not on file  Social Needs  . Financial resource strain: Not on file  . Food insecurity    Worry: Not on file    Inability: Not on file  . Transportation needs    Medical: Not on file    Non-medical: Not on file  Tobacco Use  . Smoking status: Never Smoker  . Smokeless tobacco: Never Used  Substance and Sexual Activity  . Alcohol use: No    Frequency: Never  . Drug use: No  . Sexual activity: Not on file  Lifestyle  . Physical activity    Days per week: Not on file    Minutes per session: Not on file  . Stress: Not on file  Relationships  . Social Musician on phone: Not on file    Gets together: Not on file    Attends religious service: Not on file    Active member of club or organization: Not on file    Attends meetings of clubs or organizations: Not on file    Relationship status: Not on file  .  Intimate partner violence    Fear of current or ex partner: Not on file    Emotionally abused: Not on file    Physically abused: Not on file    Forced sexual activity: Not on file  Other Topics Concern  . Not on file  Social History Narrative  . Not on file    Vital Signs: Blood pressure 130/90, pulse 78, temperature 97.7 F (36.5 C), resp. rate 16, height 5\' 4"  (1.626 m), weight 220 lb (99.8 kg), SpO2 98 %.  Examination: General Appearance: The patient is well-developed, well-nourished, and in no distress. Skin: Gross inspection of skin unremarkable. Head: normocephalic, no gross deformities. Eyes: no gross deformities  noted. ENT: ears appear grossly normal no exudates. Neck: Supple. No thyromegaly. No LAD. Respiratory: clear bilaterally. Cardiovascular: Normal S1 and S2 without murmur or rub. Extremities: No cyanosis. pulses are equal. Neurologic: Alert and oriented. No involuntary movements.  LABS: Recent Results (from the past 2160 hour(s))  POCT HgB A1C     Status: Abnormal   Collection Time: 06/28/19  3:34 PM  Result Value Ref Range   Hemoglobin A1C 6.7 (A) 4.0 - 5.6 %   HbA1c POC (<> result, manual entry)     HbA1c, POC (prediabetic range)     HbA1c, POC (controlled diabetic range)      Radiology: Mm Add Views Bil Br Bccp Scr ([facility] Hx)  Result Date: 09/04/2013 * PRIOR REPORT IMPORTED FROM AN EXTERNAL SYSTEM * CLINICAL DATA:  Screening. EXAM: DIGITAL SCREENING BILATERAL MAMMOGRAM WITH CAD COMPARISON:  Previous exam(s). ACR Breast Density Category b: There are scattered areas of fibroglandular density. FINDINGS: There are no findings suspicious for malignancy. Images were processed with CAD. IMPRESSION: No mammographic evidence of malignancy. A result letter of this screening mammogram will be mailed directly to the patient. RECOMMENDATION: Screening mammogram in one year. (Code:SM-B-01Y) BI-RADS CATEGORY  1: Negative Electronically Signed   By: Everlean Alstrom M.D.   On: 09/05/2013 08:39     No results found.  No results found.    Assessment and Plan: Patient Active Problem List   Diagnosis Date Noted  . Type 2 diabetes mellitus with hyperglycemia ([place]) 07/08/2019  . Primary pulmonary HTN ([place]) 11/01/2018  . Family history of breast cancer 07/04/2018  . Flu vaccine need 07/04/2018  . Routine cervical smear 03/12/2018  . Uncontrolled type 2 diabetes mellitus with hyperglycemia ([place]) 03/12/2018  . Mixed hyperlipidemia 03/12/2018  . Vitamin D deficiency 03/12/2018  . Dysuria 03/12/2018  . Need for prophylactic vaccination against diphtheria-tetanus-pertussis (DTP) 03/12/2018  .  Essential hypertension 11/24/2017    1. Primary pulmonary HTN (HCC) Appears stable, continue present management.   2. Essential hypertension Stable, continue current medications.    3. Obesity (BMI 30-39.9) Obesity Counseling: Risk Assessment: An assessment of behavioral risk factors was made today and includes lack of exercise sedentary lifestyle, lack of portion control and poor dietary habits.  Risk Modification Advice: She was counseled on portion control guidelines. Restricting daily caloric intake to. . The detrimental long term effects of obesity on her health and ongoing poor compliance was also discussed with the patient.    General Counseling: I have discussed the findings of the evaluation and examination with [facility].  I have also discussed any further diagnostic evaluation thatmay be needed or ordered today. Cellie verbalizes understanding of the findings of todays visit. We also reviewed her medications today and discussed drug interactions and side effects including but not limited excessive drowsiness and altered mental  states. We also discussed that there is always a risk not just to her but also people around her. she has been encouraged to call the office with any questions or concerns that should arise related to todays visit.    Time spent: 15 This patient was seen by Blima LedgerAdam Ehtan Delfavero AGNP-C in Collaboration with Dr. Freda MunroSaadat Khan as a part of collaborative care agreement.   I have personally obtained a history, examined the patient, evaluated laboratory and imaging results, formulated the assessment and plan and placed orders.    Yevonne PaxSaadat A Khan, MD [facility], [place] Pulmonary and Critical Care Sleep medicine

## 2019-10-16 ENCOUNTER — Other Ambulatory Visit: Payer: Self-pay | Admitting: Nurse Practitioner

## 2019-10-17 LAB — COMPREHENSIVE METABOLIC PANEL
ALT: 27 IU/L (ref 0–32)
AST: 24 IU/L (ref 0–40)
Albumin/Globulin Ratio: 1.9 (ref 1.2–2.2)
Albumin: 4.6 g/dL (ref 3.8–4.8)
Alkaline Phosphatase: 77 IU/L (ref 39–117)
BUN/Creatinine Ratio: 15 (ref 9–23)
BUN: 11 mg/dL (ref 6–24)
Bilirubin Total: 0.4 mg/dL (ref 0.0–1.2)
CO2: 24 mmol/L (ref 20–29)
Calcium: 9.7 mg/dL (ref 8.7–10.2)
Chloride: 100 mmol/L (ref 96–106)
Creatinine, Ser: 0.74 mg/dL (ref 0.57–1.00)
GFR calc Af Amer: 109 mL/min/{1.73_m2} (ref >59)
GFR calc non Af Amer: 95 mL/min/{1.73_m2} (ref >59)
Globulin, Total: 2.4 g/dL (ref 1.5–4.5)
Glucose: 123 mg/dL — ABNORMAL HIGH (ref 65–99)
Potassium: 3.7 mmol/L (ref 3.5–5.2)
Sodium: 138 mmol/L (ref 134–144)
Total Protein: 7 g/dL (ref 6.0–8.5)

## 2019-10-17 LAB — VITAMIN D 25 HYDROXY (VIT D DEFICIENCY, FRACTURES): Vit D, 25-Hydroxy: 13.3 ng/mL — ABNORMAL LOW (ref 30.0–100.0)

## 2019-10-17 LAB — CBC
Hematocrit: 39.8 % (ref 34.0–46.6)
Hemoglobin: 13.8 g/dL (ref 11.1–15.9)
MCH: 29.9 pg (ref 26.6–33.0)
MCHC: 34.7 g/dL (ref 31.5–35.7)
MCV: 86 fL (ref 79–97)
Platelets: 311 10*3/uL (ref 150–450)
RBC: 4.61 x10E6/uL (ref 3.77–5.28)
RDW: 13 % (ref 11.7–15.4)
WBC: 6.3 10*3/uL (ref 3.4–10.8)

## 2019-10-17 LAB — TSH: TSH: 2.52 u[IU]/mL (ref 0.450–4.500)

## 2019-10-17 LAB — T4, FREE: Free T4: 0.96 ng/dL (ref 0.82–1.77)

## 2019-10-17 LAB — LIPID PANEL W/O CHOL/HDL RATIO
Cholesterol, Total: 218 mg/dL — ABNORMAL HIGH (ref 100–199)
HDL: 46 mg/dL (ref >39)
LDL Chol Calc (NIH): 141 mg/dL — ABNORMAL HIGH (ref 0–99)
Triglycerides: 175 mg/dL — ABNORMAL HIGH (ref 0–149)
VLDL Cholesterol Cal: 31 mg/dL (ref 5–40)

## 2019-10-22 NOTE — Progress Notes (Signed)
Review labs at visit 10/29/2019

## 2019-10-25 ENCOUNTER — Telehealth: Payer: Self-pay

## 2019-10-25 NOTE — Telephone Encounter (Signed)
Called lmom informing patient of virtual visit. klh 

## 2019-10-26 ENCOUNTER — Telehealth: Payer: Self-pay

## 2019-10-26 NOTE — Telephone Encounter (Signed)
PATIENT CONFIRMED 10-29-19 OV AS VIRTUAL.

## 2019-10-29 ENCOUNTER — Ambulatory Visit (INDEPENDENT_AMBULATORY_CARE_PROVIDER_SITE_OTHER): Payer: BLUE CROSS/BLUE SHIELD | Admitting: Nurse Practitioner

## 2019-10-29 ENCOUNTER — Encounter: Payer: Self-pay | Admitting: Nurse Practitioner

## 2019-10-29 VITALS — BP 128/70 | Ht 64.0 in | Wt 215.0 lb

## 2019-10-29 DIAGNOSIS — E782 Mixed hyperlipidemia: Secondary | ICD-10-CM

## 2019-10-29 DIAGNOSIS — I10 Essential (primary) hypertension: Secondary | ICD-10-CM

## 2019-10-29 DIAGNOSIS — E559 Vitamin D deficiency, unspecified: Secondary | ICD-10-CM

## 2019-10-29 DIAGNOSIS — E1165 Type 2 diabetes mellitus with hyperglycemia: Secondary | ICD-10-CM | POA: Diagnosis not present

## 2019-10-29 MED ORDER — ERGOCALCIFEROL 1.25 MG (50000 UT) PO CAPS: 50000.0000 [IU] | ORAL_CAPSULE | ORAL | 5 refills | Status: DC

## 2019-10-29 NOTE — Progress Notes (Signed)
[facility] [place], [place] [address]  Internal MEDICINE  Telephone Visit  Patient Name: Carol Lewis  [id number]  [id number]  Date of Service: 10/29/2019  I connected with the patient at 4:32pm by webcam and verified the patients identity using two identifiers.   I discussed the limitations, risks, security and privacy concerns of performing an evaluation and management service by webcam and the availability of in person appointments. I also discussed with the patient that there may be a patient responsible charge related to the service.  The patient expressed understanding and agrees to proceed.    Chief Complaint  Patient presents with  . Telephone Assessment  . Telephone Screen  . Hypertension  . Diabetes    The patient has been contacted via webcam for follow up visit due to concerns for spread of novel coronavirus. The patient presents for routine visit. Blood pressure is doing well. She states that blood sugars are typically running in the 120s and 130s, depending on diet. Last two HgbA1cs were 6.7. she has had labs done since she was last seen. She does have vitamin d deficiency with her vitamin d level at 75.3. her LDL and total cholesterol levels were mildly elevated. Triglycerides were also mildly elevated, however, this value was 20 points better than check last year. She has no new concerns or complaints.       Current Medication: Outpatient Encounter Medications as of 10/29/2019  Medication Sig  . ACCU-CHEK FASTCLIX LANCETS MISC Use once daily diag e11.65  . furosemide (LASIX) 20 MG tablet Take 1 tab po by daily for swelling  . glucose blood (ACCU-CHEK GUIDE) test strip 1 each by Other route daily. Use as instructed  . glyBURIDE-metformin (GLUCOVANCE) 2.5-500 MG tablet Take 1 tab by po in the morning and 2 tab in the evening  . hydrochlorothiazide (HYDRODIURIL) 25 MG tablet Take 1 tablet (25 mg total) by mouth daily.  Marland Kitchen lisinopril (ZESTRIL)  40 MG tablet TAKE 1 TABLET BY MOUTH DAILY FOR BLOOD PRESSURE  . ergocalciferol (DRISDOL) 1.25 MG (50000 UT) capsule Take 1 capsule (50,000 Units total) by mouth once a week.   No facility-administered encounter medications on file as of 10/29/2019.    Surgical History: History reviewed. No pertinent surgical history.  Medical History: Past Medical History:  Diagnosis Date  . Diabetes mellitus without complication ([employer])   . Hypertension   . Pulmonary hypertension (HCC)     Family History: Family History  Problem Relation Age of Onset  . Breast cancer Mother   . Diabetes Mother   . Bladder Cancer Father     Social History   Socioeconomic History  . Marital status: Married    Spouse name: Not on file  . Number of children: Not on file  . Years of education: Not on file  . Highest education level: Not on file  Occupational History  . Not on file  Tobacco Use  . Smoking status: Never Smoker  . Smokeless tobacco: Never Used  Substance and Sexual Activity  . Alcohol use: No  . Drug use: No  . Sexual activity: Not on file  Other Topics Concern  . Not on file  Social History Narrative  . Not on file   Social Determinants of Health   Financial Resource Strain:   . Difficulty of Paying Living Expenses: Not on file  Food Insecurity:   . Worried About Charity fundraiser in the Last Year: Not on file  . Ran  Out of Food in the Last Year: Not on file  Transportation Needs:   . Lack of Transportation (Medical): Not on file  . Lack of Transportation (Non-Medical): Not on file  Physical Activity:   . Days of Exercise per Week: Not on file  . Minutes of Exercise per Session: Not on file  Stress:   . Feeling of Stress : Not on file  Social Connections:   . Frequency of Communication with Friends and Family: Not on file  . Frequency of Social Gatherings with Friends and Family: Not on file  . Attends Religious Services: Not on file  . Active Member of Clubs or  Organizations: Not on file  . Attends Banker Meetings: Not on file  . Marital Status: Not on file  Intimate Partner Violence:   . Fear of Current or Ex-Partner: Not on file  . Emotionally Abused: Not on file  . Physically Abused: Not on file  . Sexually Abused: Not on file      Review of Systems  Constitutional: Negative for activity change, appetite change, chills, fatigue and fever.       Four pound weight loss since her last visit.   HENT: Negative for congestion, ear pain, facial swelling, hearing loss, rhinorrhea, sinus pain and sore throat.   Respiratory: Negative for apnea, cough, chest tightness, shortness of breath and wheezing.   Cardiovascular: Negative for chest pain and palpitations.       Good blood pressure today.   Gastrointestinal: Negative for abdominal distention, abdominal pain, anal bleeding, constipation, diarrhea, nausea and vomiting.  Endocrine: Negative for cold intolerance, heat intolerance, polyphagia and polyuria.       Blood sugars doing well   Musculoskeletal: Negative for arthralgias, back pain and myalgias.  Skin: Negative for color change and rash.  Allergic/Immunologic: Negative for environmental allergies.  Neurological: Negative for dizziness, tremors, facial asymmetry, weakness, numbness and headaches.  Hematological: Negative for adenopathy.  Psychiatric/Behavioral: Negative for behavioral problems and dysphoric mood. The patient is not nervous/anxious.     Today's Vitals   10/29/19 1528  BP: 128/70  Weight: 215 lb (97.5 kg)  Height: 5\' 4"  (1.626 m)   Body mass index is 36.9 kg/m.  Observation/Objective:   The patient is alert and oriented. She is pleasant and answers all questions appropriately. Breathing is non-labored. She is in no acute distress at this time.    Assessment/Plan: 1. Type 2 diabetes mellitus with hyperglycemia, without long-term current use of insulin (HCC) Blood sugars stable. Continue medication as  prescribed. Check HgbA1c at next, in-office visit.   2. Essential hypertension Good blood pressure today. Continue bp medication as prescribed   3. Mixed hyperlipidemia Discussed elevated lipid panel. Suggested limitation of fried and fatty foods in the diet and increasing exercise to help lower cholesterol.   4. Vitamin D deficiency Start drisdol and treat weekly for next six months.  - ergocalciferol (DRISDOL) 1.25 MG (50000 UT) capsule; Take 1 capsule (50,000 Units total) by mouth once a week.  Dispense: 4 capsule; Refill: 5  General Counseling: Sinda verbalizes understanding of the findings of today's phone visit and agrees with plan of treatment. I have discussed any further diagnostic evaluation that may be needed or ordered today. We also reviewed her medications today. she has been encouraged to call the office with any questions or concerns that should arise related to todays visit.  Diabetes Counseling:  1. Addition of ACE inh/ ARB'S for nephroprotection. Microalbumin is updated  2. Diabetic  foot care, prevention of complications. Podiatry consult 3. Exercise and lose weight.  4. Diabetic eye examination, Diabetic eye exam is updated  5. Monitor blood sugar closlely. nutrition counseling.  6. Sign and symptoms of hypoglycemia including shaking sweating,confusion and headaches.  This patient was seen by Vincent Gros FNP Collaboration with Dr Lyndon Code as a part of collaborative care agreement  Meds ordered this encounter  Medications  . ergocalciferol (DRISDOL) 1.25 MG (50000 UT) capsule    Sig: Take 1 capsule (50,000 Units total) by mouth once a week.    Dispense:  4 capsule    Refill:  5    Order Specific Question:   Supervising Provider    Answer:   Lyndon Code [[id number]]    Time spent: 16 Minutes    Dr Lyndon Code Internal medicine

## 2019-11-08 ENCOUNTER — Other Ambulatory Visit: Payer: Self-pay

## 2019-11-08 DIAGNOSIS — E1165 Type 2 diabetes mellitus with hyperglycemia: Secondary | ICD-10-CM

## 2019-11-08 DIAGNOSIS — I10 Essential (primary) hypertension: Secondary | ICD-10-CM

## 2019-11-08 MED ORDER — GLYBURIDE-METFORMIN 2.5-500 MG PO TABS: ORAL_TABLET | ORAL | 5 refills | Status: DC

## 2019-11-08 MED ORDER — LISINOPRIL 40 MG PO TABS: ORAL_TABLET | ORAL | 6 refills | Status: DC

## 2020-01-04 ENCOUNTER — Other Ambulatory Visit: Payer: Self-pay

## 2020-01-04 DIAGNOSIS — I10 Essential (primary) hypertension: Secondary | ICD-10-CM

## 2020-01-04 MED ORDER — HYDROCHLOROTHIAZIDE 25 MG PO TABS: 25.0000 mg | ORAL_TABLET | Freq: Every day | ORAL | 5 refills | Status: DC

## 2020-01-11 ENCOUNTER — Telehealth: Payer: Self-pay

## 2020-01-11 NOTE — Telephone Encounter (Signed)
Called lmom informing patient of appointment on 01/14/2020.klh 

## 2020-01-15 ENCOUNTER — Encounter: Payer: Self-pay | Admitting: Internal Medicine

## 2020-01-15 ENCOUNTER — Ambulatory Visit (INDEPENDENT_AMBULATORY_CARE_PROVIDER_SITE_OTHER): Payer: BLUE CROSS/BLUE SHIELD | Admitting: Internal Medicine

## 2020-01-15 ENCOUNTER — Other Ambulatory Visit: Payer: Self-pay

## 2020-01-15 VITALS — BP 120/82 | HR 92 | Temp 97.4°F | Resp 16 | Ht 64.0 in | Wt 220.0 lb

## 2020-01-15 DIAGNOSIS — I10 Essential (primary) hypertension: Secondary | ICD-10-CM

## 2020-01-15 DIAGNOSIS — I270 Primary pulmonary hypertension: Secondary | ICD-10-CM | POA: Diagnosis not present

## 2020-01-15 DIAGNOSIS — Z6837 Body mass index (BMI) 37.0-37.9, adult: Secondary | ICD-10-CM | POA: Diagnosis not present

## 2020-01-15 NOTE — Progress Notes (Signed)
[facility] [place],  [address]  Pulmonary Sleep Medicine   Office Visit Note  Patient Name: Carol Lewis DOB: [birth year] MRN [id number]  Date of Service: 01/15/2020  Complaints/HPI: Pt is here for pulmonary follow up.She is seen for pulmonary HTN.   Overall she is doing well.  She reports her breathing has been well controlled.  She denies any new issues.      ROS  General: (-) fever, (-) chills, (-) night sweats, (-) weakness Skin: (-) rashes, (-) itching,. Eyes: (-) visual changes, (-) redness, (-) itching. Nose and Sinuses: (-) nasal stuffiness or itchiness, (-) postnasal drip, (-) nosebleeds, (-) sinus trouble. Mouth and Throat: (-) sore throat, (-) hoarseness. Neck: (-) swollen glands, (-) enlarged thyroid, (-) neck pain. Respiratory: - cough, (-) bloody sputum, - shortness of breath, - wheezing. Cardiovascular: - ankle swelling, (-) chest pain. Lymphatic: (-) lymph node enlargement. Neurologic: (-) numbness, (-) tingling. Psychiatric: (-) anxiety, (-) depression   Current Medication: Outpatient Encounter Medications as of 01/15/2020  Medication Sig  . ACCU-CHEK FASTCLIX LANCETS MISC Use once daily diag e11.65  . ergocalciferol (DRISDOL) 1.25 MG (50000 UT) capsule Take 1 capsule (50,000 Units total) by mouth once a week.  . furosemide (LASIX) 20 MG tablet Take 1 tab po by daily for swelling  . glucose blood (ACCU-CHEK GUIDE) test strip 1 each by Other route daily. Use as instructed  . glyBURIDE-metformin (GLUCOVANCE) 2.5-500 MG tablet Take 1 tab by po in the morning and 2 tab in the evening  . hydrochlorothiazide (HYDRODIURIL) 25 MG tablet Take 1 tablet (25 mg total) by mouth daily.  Marland Kitchen lisinopril (ZESTRIL) 40 MG tablet TAKE 1 TABLET BY MOUTH DAILY FOR BLOOD PRESSURE   No facility-administered encounter medications on file as of 01/15/2020.    Surgical History: History reviewed. No pertinent surgical history.  Medical  History: Past Medical History:  Diagnosis Date  . Diabetes mellitus without complication ([place])   . Hypertension   . Pulmonary hypertension (HCC)     Family History: Family History  Problem Relation Age of Onset  . Breast cancer Mother   . Diabetes Mother   . Bladder Cancer Father     Social History: Social History   Socioeconomic History  . Marital status: Married    Spouse name: Not on file  . Number of children: Not on file  . Years of education: Not on file  . Highest education level: Not on file  Occupational History  . Not on file  Tobacco Use  . Smoking status: Never Smoker  . Smokeless tobacco: Never Used  Substance and Sexual Activity  . Alcohol use: No  . Drug use: No  . Sexual activity: Not on file  Other Topics Concern  . Not on file  Social History Narrative  . Not on file   Social Determinants of Health   Financial Resource Strain:   . Difficulty of Paying Living Expenses:   Food Insecurity:   . Worried About Charity fundraiser in the Last Year:   . Arboriculturist in the Last Year:   Transportation Needs:   . Film/video editor (Medical):   Marland Kitchen Lack of Transportation (Non-Medical):   Physical Activity:   . Days of Exercise per Week:   . Minutes of Exercise per Session:   Stress:   . Feeling of Stress :   Social Connections:   . Frequency of Communication with Friends and Family:   . Frequency  of Social Gatherings with Friends and Family:   . Attends Religious Services:   . Active Member of Clubs or Organizations:   . Attends Banker Meetings:   Marland Kitchen Marital Status:   Intimate Partner Violence:   . Fear of Current or Ex-Partner:   . Emotionally Abused:   Marland Kitchen Physically Abused:   . Sexually Abused:     Vital Signs: Blood pressure 120/82, pulse 92, temperature (!) 97.4 F (36.3 C), resp. rate 16, height 5\' 4"  (1.626 m), weight 220 lb (99.8 kg), SpO2 98 %.  Examination: General Appearance: The patient is well-developed,  well-nourished, and in no distress. Skin: Gross inspection of skin unremarkable. Head: normocephalic, no gross deformities. Eyes: no gross deformities noted. ENT: ears appear grossly normal no exudates. Neck: Supple. No thyromegaly. No LAD. Respiratory: clear bilateraly. Cardiovascular: Normal S1 and S2 without murmur or rub. Extremities: No cyanosis. pulses are equal. Neurologic: Alert and oriented. No involuntary movements.  LABS: No results found for this or any previous visit (from the past 2160 hour(s)).  Radiology: MM ADD VIEWS BIL BR BCCP SCR ([facility] HX)  Result Date: 09/04/2013 * PRIOR REPORT IMPORTED FROM AN EXTERNAL SYSTEM * CLINICAL DATA:  Screening. EXAM: DIGITAL SCREENING BILATERAL MAMMOGRAM WITH CAD COMPARISON:  Previous exam(s). ACR Breast Density Category b: There are scattered areas of fibroglandular density. FINDINGS: There are no findings suspicious for malignancy. Images were processed with CAD. IMPRESSION: No mammographic evidence of malignancy. A result letter of this screening mammogram will be mailed directly to the patient. RECOMMENDATION: Screening mammogram in one year. (Code:SM-B-01Y) BI-RADS CATEGORY  1: Negative Electronically Signed   By: 14/05/2013 M.D.   On: 09/05/2013 08:39     No results found.  No results found.    Assessment and Plan: Patient Active Problem List   Diagnosis Date Noted  . Type 2 diabetes mellitus with hyperglycemia (HCC) 07/08/2019  . Primary pulmonary HTN (HCC) 11/01/2018  . Family history of breast cancer 07/04/2018  . Flu vaccine need 07/04/2018  . Routine cervical smear 03/12/2018  . Uncontrolled type 2 diabetes mellitus with hyperglycemia (HCC) 03/12/2018  . Mixed hyperlipidemia 03/12/2018  . Vitamin D deficiency 03/12/2018  . Dysuria 03/12/2018  . Need for prophylactic vaccination against diphtheria-tetanus-pertussis (DTP) 03/12/2018  . Essential hypertension 11/24/2017    1. Primary pulmonary HTN (HCC) Continue  with Lasix as prescribed.   2. Essential hypertension Stable, continue management.   3. BMI 37.0-37.9, adult Comorbidities include HTN,DM  General Counseling: I have discussed the findings of the evaluation and examination with 06-20-1973.  I have also discussed any further diagnostic evaluation thatmay be needed or ordered today. Catlynn verbalizes understanding of the findings of todays visit. We also reviewed her medications today and discussed drug interactions and side effects including but not limited excessive drowsiness and altered mental states. We also discussed that there is always a risk not just to her but also people around her. she has been encouraged to call the office with any questions or concerns that should arise related to todays visit.  No orders of the defined types were placed in this encounter.    Time spent: 25 This patient was seen by Judeth Cornfield AGNP-C in Collaboration with Dr. Blima Ledger as a part of collaborative care agreement.   I have personally obtained a history, examined the patient, evaluated laboratory and imaging results, formulated the assessment and plan and placed orders.    Freda Munro, MD [facility] Pulmonary and Critical Care  Sleep medicine

## 2020-01-24 ENCOUNTER — Telehealth: Payer: Self-pay

## 2020-01-24 NOTE — Telephone Encounter (Signed)
Lmom to confirm and screen for 01-28-20 ov. °

## 2020-01-28 ENCOUNTER — Other Ambulatory Visit: Payer: Self-pay

## 2020-01-28 ENCOUNTER — Encounter: Payer: Self-pay | Admitting: Nurse Practitioner

## 2020-01-28 ENCOUNTER — Ambulatory Visit (INDEPENDENT_AMBULATORY_CARE_PROVIDER_SITE_OTHER): Payer: BLUE CROSS/BLUE SHIELD | Admitting: Nurse Practitioner

## 2020-01-28 VITALS — BP 142/80 | HR 97 | Temp 97.4°F | Resp 16 | Ht 64.0 in | Wt 220.0 lb

## 2020-01-28 DIAGNOSIS — Z0001 Encounter for general adult medical examination with abnormal findings: Secondary | ICD-10-CM

## 2020-01-28 DIAGNOSIS — I270 Primary pulmonary hypertension: Secondary | ICD-10-CM

## 2020-01-28 DIAGNOSIS — E1165 Type 2 diabetes mellitus with hyperglycemia: Secondary | ICD-10-CM

## 2020-01-28 DIAGNOSIS — Z1231 Encounter for screening mammogram for malignant neoplasm of breast: Secondary | ICD-10-CM

## 2020-01-28 DIAGNOSIS — I10 Essential (primary) hypertension: Secondary | ICD-10-CM | POA: Diagnosis not present

## 2020-01-28 DIAGNOSIS — R300 Dysuria: Secondary | ICD-10-CM

## 2020-01-28 LAB — POCT GLYCOSYLATED HEMOGLOBIN (HGB A1C): Hemoglobin A1C: 8.1 % — AB (ref 4.0–5.6)

## 2020-01-28 NOTE — Progress Notes (Signed)
[facility] [place], [place] [address]  Internal MEDICINE  Office Visit Note  Patient Name: Carol Lewis  [id number]  [id number]  Date of Service: 02/10/2020   Pt is here for routine health maintenance examination   Chief Complaint  Patient presents with  . Annual Exam  . Diabetes  . Hypertension  . Quality Metric Gaps    pnuemonia vaccine, foot exam, mammogram      The patient is here for health maintenance exam. Blood sugars have been elevated. She states that she has been making some poor diet choices recently. Her family has been eating out much more often, but has improved recently. Blood pressure is well managed. She had routine, fasting labs done 09/2019. She did have vitamin d deficiency. She is currently taking drisdol 50000iu weekly. Cholesterol panel was up a bit. We have discussed dietary and lifestyle changes necessary to improve this without changing medication. She is due to have screening mammogram.   Current Medication: Outpatient Encounter Medications as of 01/28/2020  Medication Sig  . ACCU-CHEK FASTCLIX LANCETS MISC Use once daily diag e11.65  . ergocalciferol (DRISDOL) 1.25 MG (50000 UT) capsule Take 1 capsule (50,000 Units total) by mouth once a week.  . furosemide (LASIX) 20 MG tablet Take 1 tab po by daily for swelling  . glucose blood (ACCU-CHEK GUIDE) test strip 1 each by Other route daily. Use as instructed  . glyBURIDE-metformin (GLUCOVANCE) 2.5-500 MG tablet Take 1 tab by po in the morning and 2 tab in the evening  . hydrochlorothiazide (HYDRODIURIL) 25 MG tablet Take 1 tablet (25 mg total) by mouth daily.  Marland Kitchen lisinopril (ZESTRIL) 40 MG tablet TAKE 1 TABLET BY MOUTH DAILY FOR BLOOD PRESSURE   No facility-administered encounter medications on file as of 01/28/2020.    Surgical History: History reviewed. No pertinent surgical history.  Medical History: Past Medical History:  Diagnosis Date  . Diabetes mellitus without  complication ([place])   . Hypertension   . Pulmonary hypertension (HCC)     Family History: Family History  Problem Relation Age of Onset  . Breast cancer Mother   . Diabetes Mother   . Bladder Cancer Father       Review of Systems  Constitutional: Negative for activity change, appetite change, chills, fatigue and fever.  HENT: Negative for congestion, ear pain, facial swelling, hearing loss, rhinorrhea, sinus pain and sore throat.   Respiratory: Negative for apnea, cough, chest tightness, shortness of breath and wheezing.   Cardiovascular: Negative for chest pain, palpitations and leg swelling.       Good blood pressure today.   Gastrointestinal: Negative for abdominal distention, abdominal pain, anal bleeding, constipation, diarrhea, nausea and vomiting.  Endocrine: Negative for cold intolerance, heat intolerance, polyphagia and polyuria.       Blood sugars have been a little elevated recently.   Genitourinary: Negative for dysuria, frequency and hematuria.  Musculoskeletal: Negative for arthralgias, back pain and myalgias.  Skin: Negative for color change and rash.  Allergic/Immunologic: Negative for environmental allergies.  Neurological: Negative for dizziness, tremors, facial asymmetry, weakness, numbness and headaches.  Hematological: Negative for adenopathy.  Psychiatric/Behavioral: Negative for behavioral problems and dysphoric mood. The patient is not nervous/anxious.      Today's Vitals   01/28/20 1455  BP: (!) 142/80  Pulse: 97  Resp: 16  Temp: (!) 97.4 F (36.3 C)  SpO2: 99%  Weight: 220 lb (99.8 kg)  Height: 5\' 4"  (1.626 m)   Body mass index  is 37.76 kg/m.  Physical Exam Vitals and nursing note reviewed.  Constitutional:      Appearance: Normal appearance. She is well-developed. She is obese.  HENT:     Head: Normocephalic and atraumatic.     Nose: Nose normal.  Eyes:     Conjunctiva/sclera: Conjunctivae normal.     Pupils: Pupils are equal, round,  and reactive to light.  Neck:     Thyroid: No thyromegaly.     Vascular: No carotid bruit.  Cardiovascular:     Rate and Rhythm: Normal rate and regular rhythm.     Pulses: Normal pulses.          Dorsalis pedis pulses are 2+ on the right side and 2+ on the left side.       Posterior tibial pulses are 2+ on the left side.     Heart sounds: Normal heart sounds.  Pulmonary:     Effort: Pulmonary effort is normal.     Breath sounds: Normal breath sounds. No wheezing.  Chest:     Breasts:        Right: Normal. No swelling, bleeding, inverted nipple, mass, nipple discharge, skin change or tenderness.        Left: Normal. No swelling, bleeding, inverted nipple, mass, nipple discharge, skin change or tenderness.  Abdominal:     General: Bowel sounds are normal.     Palpations: Abdomen is soft.     Tenderness: There is no abdominal tenderness.  Musculoskeletal:        General: Normal range of motion.     Cervical back: Normal range of motion and neck supple.     Right foot: Normal range of motion. No deformity or bunion.     Left foot: Normal range of motion. No deformity or bunion.  Feet:     Right foot:     Protective Sensation: 10 sites tested. 10 sites sensed.     Skin integrity: Skin integrity normal.     Toenail Condition: Right toenails are normal.     Left foot:     Protective Sensation: 10 sites tested. 10 sites sensed.     Skin integrity: Skin integrity normal.     Toenail Condition: Left toenails are normal.  Lymphadenopathy:     Cervical: No cervical adenopathy.     Upper Body:     Right upper body: No axillary adenopathy.     Left upper body: No axillary adenopathy.  Skin:    General: Skin is warm and dry.  Neurological:     Mental Status: She is alert and oriented to person, place, and time.  Psychiatric:        Mood and Affect: Mood normal.        Behavior: Behavior normal.        Thought Content: Thought content normal.        Judgment: Judgment normal.     LABS: Recent Results (from the past 2160 hour(s))  Urinalysis, Routine w reflex microscopic     Status: Abnormal   Collection Time: 01/28/20  2:56 PM  Result Value Ref Range   Specific Gravity, UA 1.008 1.005 - 1.030   pH, UA 6.5 5.0 - 7.5   Color, UA Yellow Yellow   Appearance Ur Clear Clear   Leukocytes,UA Trace (A) Negative   Protein,UA Negative Negative/Trace   Glucose, UA Negative Negative   Ketones, UA Negative Negative   RBC, UA Negative Negative   Bilirubin, UA Negative Negative   Urobilinogen, Ur  0.2 0.2 - 1.0 mg/dL   Nitrite, UA Negative Negative   Microscopic Examination See below:     Comment: Microscopic was indicated and was performed.  Microscopic Examination     Status: None   Collection Time: 01/28/20  2:56 PM   URINE  Result Value Ref Range   WBC, UA None seen 0 - 5 /hpf   RBC None seen 0 - 2 /hpf   Epithelial Cells (non renal) None seen 0 - 10 /hpf   Casts None seen None seen /lpf   Bacteria, UA None seen None seen/Few  POCT HgB A1C     Status: Abnormal   Collection Time: 01/28/20  3:19 PM  Result Value Ref Range   Hemoglobin A1C 8.1 (A) 4.0 - 5.6 %   HbA1c POC (<> result, manual entry)     HbA1c, POC (prediabetic range)     HbA1c, POC (controlled diabetic range)      Assessment/Plan: 1. Encounter for general adult medical examination with abnormal findings Annual health maintenance exam today.   2. Type 2 diabetes mellitus with hyperglycemia, without long-term current use of insulin (HCC) - POCT HgB A1C 8.1 today. Discussed dietary and lifestyle changes necessary to bring down blood sugars without changes in medication. She agrees with plan. Will monitor blood sugars routinely. HgbA1c to be checked at next visit.   3. Essential hypertension Stable. Continue bp medication as prescribed   4. Primary pulmonary HTN (HCC) Stable. Patient to continue regular visits with Dr. Freda Munro for management.   5. Encounter for screening mammogram for  malignant neoplasm of breast - MM DIGITAL SCREENING BILATERAL; Future  6. Dysuria - Urinalysis, Routine w reflex microscopic  General Counseling: Neyah verbalizes understanding of the findings of todays visit and agrees with plan of treatment. I have discussed any further diagnostic evaluation that may be needed or ordered today. We also reviewed her medications today. she has been encouraged to call the office with any questions or concerns that should arise related to todays visit.    Counseling:  Diabetes Counseling:  1. Addition of ACE inh/ ARB'S for nephroprotection. Microalbumin is updated  2. Diabetic foot care, prevention of complications. Podiatry consult 3. Exercise and lose weight.  4. Diabetic eye examination, Diabetic eye exam is updated  5. Monitor blood sugar closlely. nutrition counseling.  6. Sign and symptoms of hypoglycemia including shaking sweating,confusion and headaches.  This patient was seen by Vincent Gros FNP Collaboration with Dr Lyndon Code as a part of collaborative care agreement  Orders Placed This Encounter  Procedures  . Microscopic Examination  . MM DIGITAL SCREENING BILATERAL  . Urinalysis, Routine w reflex microscopic  . POCT HgB A1C      Total time spent: 45 Minutes  Time spent includes review of chart, medications, test results, and follow up plan with the patient.     Lyndon Code, MD  Internal Medicine

## 2020-01-29 LAB — URINALYSIS, ROUTINE W REFLEX MICROSCOPIC
Bilirubin, UA: NEGATIVE
Glucose, UA: NEGATIVE
Ketones, UA: NEGATIVE
Nitrite, UA: NEGATIVE
Protein,UA: NEGATIVE
RBC, UA: NEGATIVE
Specific Gravity, UA: 1.008 (ref 1.005–1.030)
Urobilinogen, Ur: 0.2 mg/dL (ref 0.2–1.0)
pH, UA: 6.5 (ref 5.0–7.5)

## 2020-01-29 LAB — MICROSCOPIC EXAMINATION
Bacteria, UA: NONE SEEN
Casts: NONE SEEN /lpf
Epithelial Cells (non renal): NONE SEEN /hpf (ref 0–10)
RBC, Urine: NONE SEEN /hpf (ref 0–2)
WBC, UA: NONE SEEN /hpf (ref 0–5)

## 2020-02-10 DIAGNOSIS — Z0001 Encounter for general adult medical examination with abnormal findings: Secondary | ICD-10-CM | POA: Insufficient documentation

## 2020-02-10 DIAGNOSIS — Z1231 Encounter for screening mammogram for malignant neoplasm of breast: Secondary | ICD-10-CM | POA: Insufficient documentation

## 2020-04-22 ENCOUNTER — Other Ambulatory Visit: Payer: Self-pay

## 2020-04-22 DIAGNOSIS — E559 Vitamin D deficiency, unspecified: Secondary | ICD-10-CM

## 2020-04-22 MED ORDER — ERGOCALCIFEROL 1.25 MG (50000 UT) PO CAPS: 50000.0000 [IU] | ORAL_CAPSULE | ORAL | 5 refills | Status: DC

## 2020-04-25 ENCOUNTER — Telehealth: Payer: Self-pay

## 2020-04-25 NOTE — Telephone Encounter (Signed)
Confirmed and screened for 04-29-20 ov. 

## 2020-04-29 ENCOUNTER — Other Ambulatory Visit: Payer: Self-pay

## 2020-04-29 ENCOUNTER — Encounter: Payer: Self-pay | Admitting: Nurse Practitioner

## 2020-04-29 ENCOUNTER — Ambulatory Visit (INDEPENDENT_AMBULATORY_CARE_PROVIDER_SITE_OTHER): Payer: BLUE CROSS/BLUE SHIELD | Admitting: Hospice and Palliative Medicine

## 2020-04-29 VITALS — BP 142/86 | HR 92 | Temp 97.4°F | Resp 16 | Ht 64.0 in | Wt 213.2 lb

## 2020-04-29 DIAGNOSIS — E1165 Type 2 diabetes mellitus with hyperglycemia: Secondary | ICD-10-CM | POA: Diagnosis not present

## 2020-04-29 DIAGNOSIS — Z1211 Encounter for screening for malignant neoplasm of colon: Secondary | ICD-10-CM | POA: Diagnosis not present

## 2020-04-29 DIAGNOSIS — I10 Essential (primary) hypertension: Secondary | ICD-10-CM | POA: Diagnosis not present

## 2020-04-29 DIAGNOSIS — E669 Obesity, unspecified: Secondary | ICD-10-CM

## 2020-04-29 LAB — POCT GLYCOSYLATED HEMOGLOBIN (HGB A1C): Hemoglobin A1C: 7 % — AB (ref 4.0–5.6)

## 2020-04-29 NOTE — Progress Notes (Addendum)
[facility] [address] [place], [place] [address]  Internal MEDICINE  Office Visit Note  Patient Name: Carol Lewis  [id number]  [id number]  Date of Service: 05/03/2020  Chief Complaint  Patient presents with  . Follow-up    A1C check   . Diabetes  . Hypertension  . Quality Metric Gaps    colonoscopy    HPI Patient is here today for routine follow-up on DM and HTN. A1C much improved today at 7.0 from last visit. She has changed her diet and the timing of her meals and has noticed a big improvement in her AM fasting blood glucose levels. At this time we will continue with her current therapy and she will continue to work on her diet. BP slightly elevated today but she is aware that it is always elevated at the doctor's office. Routinely checks her BP at home and consistently ranges in 120's/80's.   Current Medication: Outpatient Encounter Medications as of 04/29/2020  Medication Sig  . ACCU-CHEK FASTCLIX LANCETS MISC Use once daily diag e11.65  . ergocalciferol (DRISDOL) 1.25 MG (50000 UT) capsule Take 1 capsule (50,000 Units total) by mouth once a week.  . furosemide (LASIX) 20 MG tablet Take 1 tab po by daily for swelling  . glucose blood (ACCU-CHEK GUIDE) test strip 1 each by Other route daily. Use as instructed  . glyBURIDE-metformin (GLUCOVANCE) 2.5-500 MG tablet Take 1 tab by po in the morning and 2 tab in the evening  . hydrochlorothiazide (HYDRODIURIL) 25 MG tablet Take 1 tablet (25 mg total) by mouth daily.  Marland Kitchen lisinopril (ZESTRIL) 40 MG tablet TAKE 1 TABLET BY MOUTH DAILY FOR BLOOD PRESSURE   No facility-administered encounter medications on file as of 04/29/2020.    Surgical History: History reviewed. No pertinent surgical history.  Medical History: Past Medical History:  Diagnosis Date  . Diabetes mellitus without complication (HCC)   . Hypertension   . Pulmonary hypertension (HCC)     Family History: Family History  Problem Relation Age of  Onset  . Breast cancer Mother   . Diabetes Mother   . Bladder Cancer Father     Social History   Socioeconomic History  . Marital status: Married    Spouse name: Not on file  . Number of children: Not on file  . Years of education: Not on file  . Highest education level: Not on file  Occupational History  . Not on file  Tobacco Use  . Smoking status: Never Smoker  . Smokeless tobacco: Never Used  Vaping Use  . Vaping Use: Never used  Substance and Sexual Activity  . Alcohol use: No  . Drug use: No  . Sexual activity: Not on file  Other Topics Concern  . Not on file  Social History Narrative  . Not on file   Social Determinants of Health   Financial Resource Strain:   . Difficulty of Paying Living Expenses:   Food Insecurity:   . Worried About Programme researcher, broadcasting/film/video in the Last Year:   . Barista in the Last Year:   Transportation Needs:   . Freight forwarder (Medical):   Marland Kitchen Lack of Transportation (Non-Medical):   Physical Activity:   . Days of Exercise per Week:   . Minutes of Exercise per Session:   Stress:   . Feeling of Stress :   Social Connections:   . Frequency of Communication with Friends and Family:   . Frequency of Social Gatherings  with Friends and Family:   . Attends Religious Services:   . Active Member of Clubs or Organizations:   . Attends Banker Meetings:   Marland Kitchen Marital Status:   Intimate Partner Violence:   . Fear of Current or Ex-Partner:   . Emotionally Abused:   Marland Kitchen Physically Abused:   . Sexually Abused:     Review of Systems  Constitutional: Negative.        For chills, fever, fatigue.  HENT: Negative.        For sinus pain, sinus pressure, sore throat, trouble swallowing.  Eyes: Negative.        For changes in vision or visual disturbances.  Respiratory: Negative.        For chest tightness, cough, shortness of breath, wheezing.  Cardiovascular: Negative.        For chest pain, ankle swelling, palpitations.   Gastrointestinal: Negative.        For abdominal pain, constipation, nausea, vomiting, diarrhea.  Endocrine: Negative.        For polydipsia, polyphagia, polyuria.  Genitourinary: Negative.        For dysuria, flank pain, hematuria, increased frequency, urgency.  Musculoskeletal: Negative.        For arthralgias, myalgias, back pain, neck pain, gait disturbances.  Skin: Negative.        For rash, wound.  Allergic/Immunologic: Negative.   Neurological: Negative.        For dizziness, headaches, tremors, weakness.  Hematological: Negative.   Psychiatric/Behavioral: Negative.        For confusion, depression, anxiety, sleep disturbances.    Vital Signs: BP (!) 142/86   Pulse 92   Temp (!) 97.4 F (36.3 C)   Resp 16   Ht 5\' 4"  (1.626 m)   Wt 213 lb 3.2 oz (96.7 kg)   SpO2 98%   BMI 36.60 kg/m    Physical Exam Constitutional:      Appearance: Normal appearance. She is obese.  HENT:     Nose: Nose normal.     Mouth/Throat:     Mouth: Mucous membranes are moist.     Pharynx: Oropharynx is clear.  Cardiovascular:     Rate and Rhythm: Normal rate and regular rhythm.     Pulses: Normal pulses.     Heart sounds: Normal heart sounds.  Pulmonary:     Effort: Pulmonary effort is normal.     Breath sounds: Normal breath sounds.  Abdominal:     General: Abdomen is flat. Bowel sounds are normal.     Palpations: Abdomen is soft.  Musculoskeletal:        General: Normal range of motion.     Cervical back: Normal range of motion.  Skin:    General: Skin is warm and dry.  Neurological:     General: No focal deficit present.     Mental Status: She is alert and oriented to person, place, and time. Mental status is at baseline.  Psychiatric:        Mood and Affect: Mood normal.        Thought Content: Thought content normal.    Assessment/Plan: 1. Type 2 diabetes mellitus with hyperglycemia, without long-term current use of insulin (HCC) A1C at 7 today, much improved from  previous check. Continue with current therapy at this time and advised to place further emphasis on diet and exercise. - POCT HgB A1C  2. Essential hypertension Potential white coat syndrome as when she routinely checks BP at home it  is normal. Encourage her to continue taking BP at home and bring a log to each follow-up visit.  3. Obesity (BMI 30-39.9) Discussed the importance of weight loss. Discussed the negative side effects obesity has on her HTN and well as negative impacts along with DM it can have on overall health. Encouraged to further improve her diet and incorporate exercise into her daily routine.  4. Colon cancer screening - Will schedule colonoscopy on next visit  General Counseling: Lorree verbalizes understanding of the findings of todays visit and agrees with plan of treatment. I have discussed any further diagnostic evaluation that may be needed or ordered today. We also reviewed her medications today. she has been encouraged to call the office with any questions or concerns that should arise related to todays visit.    Orders Placed This Encounter  Procedures  . POCT HgB A1C   Total time spent:  This patient was seen by Leeanne Deed, AGNP-C in collaboration with Dr. Lyndon Code as part of a collaborative care agreement.  Time spent includes review of chart, medications, test results, and follow up plan with the patient.   Lubertha Basque Tiburcio Pea, AGNP-C  Dr Lyndon Code Internal medicine

## 2020-05-05 ENCOUNTER — Other Ambulatory Visit: Payer: Self-pay

## 2020-05-05 DIAGNOSIS — E1165 Type 2 diabetes mellitus with hyperglycemia: Secondary | ICD-10-CM

## 2020-05-05 MED ORDER — GLYBURIDE-METFORMIN 2.5-500 MG PO TABS: ORAL_TABLET | ORAL | 5 refills | Status: DC

## 2020-05-13 ENCOUNTER — Other Ambulatory Visit: Payer: Self-pay

## 2020-05-13 DIAGNOSIS — I10 Essential (primary) hypertension: Secondary | ICD-10-CM

## 2020-05-13 MED ORDER — LISINOPRIL 40 MG PO TABS: ORAL_TABLET | ORAL | 6 refills | Status: DC

## 2020-05-20 ENCOUNTER — Other Ambulatory Visit: Payer: Self-pay

## 2020-05-20 ENCOUNTER — Encounter: Payer: Self-pay | Admitting: Internal Medicine

## 2020-05-20 ENCOUNTER — Ambulatory Visit: Payer: BLUE CROSS/BLUE SHIELD | Admitting: Internal Medicine

## 2020-05-20 DIAGNOSIS — I10 Essential (primary) hypertension: Secondary | ICD-10-CM | POA: Diagnosis not present

## 2020-05-20 DIAGNOSIS — I270 Primary pulmonary hypertension: Secondary | ICD-10-CM

## 2020-05-20 MED ORDER — HYDROCHLOROTHIAZIDE 25 MG PO TABS: 25.0000 mg | ORAL_TABLET | Freq: Every day | ORAL | 5 refills | Status: DC

## 2020-05-20 MED ORDER — LISINOPRIL 40 MG PO TABS: ORAL_TABLET | ORAL | 6 refills | Status: DC

## 2020-05-20 NOTE — Progress Notes (Signed)
[facility] [address] [place], [place] [address]  Pulmonary Sleep Medicine   Office Visit Note  Patient Name: Carol Lewis DOB: [birth year] MRN [id number]  Date of Service: 05/21/2020  Complaints/HPI: Patient is here for follow-up. She is being followed and managed for pulmonary hypertension, says her symptoms have remained stable. Continues on 20 mg furosemide. She says she normally takes lasix every other day, but during summer months she feels she needs it daily.  ROS  General: (-) fever, (-) chills, (-) night sweats, (-) weakness Skin: (-) rashes, (-) itching,. Eyes: (-) visual changes, (-) redness, (-) itching. Nose and Sinuses: (-) nasal stuffiness or itchiness, (-) postnasal drip, (-) nosebleeds, (-) sinus trouble. Mouth and Throat: (-) sore throat, (-) hoarseness. Neck: (-) swollen glands, (-) enlarged thyroid, (-) neck pain. Respiratory: - cough, (-) bloody sputum, - shortness of breath, - wheezing. Cardiovascular: - ankle swelling, (-) chest pain. Lymphatic: (-) lymph node enlargement. Neurologic: (-) numbness, (-) tingling. Psychiatric: (-) anxiety, (-) depression   Current Medication: Outpatient Encounter Medications as of 05/20/2020  Medication Sig  . ACCU-CHEK FASTCLIX LANCETS MISC Use once daily diag e11.65  . ergocalciferol (DRISDOL) 1.25 MG (50000 UT) capsule Take 1 capsule (50,000 Units total) by mouth once a week.  . furosemide (LASIX) 20 MG tablet Take 1 tab po by daily for swelling  . glucose blood (ACCU-CHEK GUIDE) test strip 1 each by Other route daily. Use as instructed  . glyBURIDE-metformin (GLUCOVANCE) 2.5-500 MG tablet Take 1 tab by po in the morning and 2 tab in the evening  . hydrochlorothiazide (HYDRODIURIL) 25 MG tablet Take 1 tablet (25 mg total) by mouth daily.  Marland Kitchen lisinopril (ZESTRIL) 40 MG tablet TAKE 1 TABLET BY MOUTH DAILY FOR BLOOD PRESSURE  . [DISCONTINUED] hydrochlorothiazide (HYDRODIURIL) 25 MG tablet Take 1 tablet  (25 mg total) by mouth daily.  . [DISCONTINUED] lisinopril (ZESTRIL) 40 MG tablet TAKE 1 TABLET BY MOUTH DAILY FOR BLOOD PRESSURE   No facility-administered encounter medications on file as of 05/20/2020.    Surgical History: History reviewed. No pertinent surgical history.  Medical History: Past Medical History:  Diagnosis Date  . Diabetes mellitus without complication (HCC)   . Hypertension   . Pulmonary hypertension (HCC)     Family History: Family History  Problem Relation Age of Onset  . Breast cancer Mother   . Diabetes Mother   . Bladder Cancer Father     Social History: Social History   Socioeconomic History  . Marital status: Married    Spouse name: Not on file  . Number of children: Not on file  . Years of education: Not on file  . Highest education level: Not on file  Occupational History  . Not on file  Tobacco Use  . Smoking status: Never Smoker  . Smokeless tobacco: Never Used  Vaping Use  . Vaping Use: Never used  Substance and Sexual Activity  . Alcohol use: No  . Drug use: No  . Sexual activity: Not on file  Other Topics Concern  . Not on file  Social History Narrative  . Not on file   Social Determinants of Health   Financial Resource Strain:   . Difficulty of Paying Living Expenses: Not on file  Food Insecurity:   . Worried About Programme researcher, broadcasting/film/video in the Last Year: Not on file  . Ran Out of Food in the Last Year: Not on file  Transportation Needs:   . Lack of Transportation (Medical):  Not on file  . Lack of Transportation (Non-Medical): Not on file  Physical Activity:   . Days of Exercise per Week: Not on file  . Minutes of Exercise per Session: Not on file  Stress:   . Feeling of Stress : Not on file  Social Connections:   . Frequency of Communication with Friends and Family: Not on file  . Frequency of Social Gatherings with Friends and Family: Not on file  . Attends Religious Services: Not on file  . Active Member of Clubs or  Organizations: Not on file  . Attends Banker Meetings: Not on file  . Marital Status: Not on file  Intimate Partner Violence:   . Fear of Current or Ex-Partner: Not on file  . Emotionally Abused: Not on file  . Physically Abused: Not on file  . Sexually Abused: Not on file    Vital Signs: Blood pressure 132/86, pulse 98, temperature 98.1 F (36.7 C), resp. rate 16, height 5\' 4"  (1.626 m), weight 215 lb (97.5 kg), SpO2 100 %.  Examination: General Appearance: The patient is well-developed, well-nourished, and in no distress. Skin: Gross inspection of skin unremarkable. Head: normocephalic, no gross deformities. Eyes: no gross deformities noted. ENT: ears appear grossly normal no exudates. Neck: Supple. No thyromegaly. No LAD. Respiratory: Clear lung sounds bilaterally. Cardiovascular: Normal S1 and S2 without murmur or rub. Extremities: No cyanosis. pulses are equal. Neurologic: Alert and oriented. No involuntary movements.  LABS: Recent Results (from the past 2160 hour(s))  POCT HgB A1C     Status: Abnormal   Collection Time: 04/29/20  3:09 PM  Result Value Ref Range   Hemoglobin A1C 7.0 (A) 4.0 - 5.6 %   HbA1c POC (<> result, manual entry)     HbA1c, POC (prediabetic range)     HbA1c, POC (controlled diabetic range)      Radiology: MM ADD VIEWS BIL BR BCCP SCR ([facility] HX)  Result Date: 09/04/2013 * PRIOR REPORT IMPORTED FROM AN EXTERNAL SYSTEM * CLINICAL DATA:  Screening. EXAM: DIGITAL SCREENING BILATERAL MAMMOGRAM WITH CAD COMPARISON:  Previous exam(s). ACR Breast Density Category b: There are scattered areas of fibroglandular density. FINDINGS: There are no findings suspicious for malignancy. Images were processed with CAD. IMPRESSION: No mammographic evidence of malignancy. A result letter of this screening mammogram will be mailed directly to the patient. RECOMMENDATION: Screening mammogram in one year. (Code:SM-B-01Y) BI-RADS CATEGORY  1: Negative  Electronically Signed   By: 14/05/2013 M.D.   On: 09/05/2013 08:39    Assessment and Plan: Patient Active Problem List   Diagnosis Date Noted  . Encounter for general adult medical examination with abnormal findings 02/10/2020  . Encounter for screening mammogram for malignant neoplasm of breast 02/10/2020  . Type 2 diabetes mellitus with hyperglycemia (HCC) 07/08/2019  . Primary pulmonary HTN (HCC) 11/01/2018  . Family history of breast cancer 07/04/2018  . Flu vaccine need 07/04/2018  . Routine cervical smear 03/12/2018  . Uncontrolled type 2 diabetes mellitus with hyperglycemia (HCC) 03/12/2018  . Mixed hyperlipidemia 03/12/2018  . Vitamin D deficiency 03/12/2018  . Dysuria 03/12/2018  . Need for prophylactic vaccination against diphtheria-tetanus-pertussis (DTP) 03/12/2018  . Essential hypertension 11/24/2017    1. Pulmonary hypertension, primary (HCC) Symptoms remain stable on 20 mg furosemide. Has been taking medication daily for about 2 months, CMP to monitor potassium levels. Repeat echo from 2019 to monitor stabilization or progression of disease. - COMPLETE METABOLIC PANEL WITH GFR - ECHOCARDIOGRAM COMPLETE; Future  2. Essential hypertension BP stable today, requesting refills. Will continue monitoring BP. - lisinopril (ZESTRIL) 40 MG tablet; TAKE 1 TABLET BY MOUTH DAILY FOR BLOOD PRESSURE  Dispense: 30 tablet; Refill: 6 - hydrochlorothiazide (HYDRODIURIL) 25 MG tablet; Take 1 tablet (25 mg total) by mouth daily.  Dispense: 30 tablet; Refill: 5  General Counseling: I have discussed the findings of the evaluation and examination with Judeth Cornfield.  I have also discussed any further diagnostic evaluation thatmay be needed or ordered today. Maha verbalizes understanding of the findings of todays visit. We also reviewed her medications today and discussed drug interactions and side effects including but not limited excessive drowsiness and altered mental states. We also  discussed that there is always a risk not just to her but also people around her. she has been encouraged to call the office with any questions or concerns that should arise related to todays visit.  Orders Placed This Encounter  Procedures  . COMPLETE METABOLIC PANEL WITH GFR  . ECHOCARDIOGRAM COMPLETE    Standing Status:   Future    Standing Expiration Date:   05/20/2021    Order Specific Question:   Where should this test be performed    Answer:   External    Order Specific Question:   Perflutren DEFINITY (image enhancing agent) should be administered unless hypersensitivity or allergy exist    Answer:   Administer Perflutren    Order Specific Question:   Reason for exam-Echo    Answer:   Pulmonary hypertension  416.8 / I27.2     Time spent:25  I have personally obtained a history, examined the patient, evaluated laboratory and imaging results, formulated the assessment and plan and placed orders. This patient was seen by Brent General AGNP-C in Collaboration with Dr. Freda Munro as a part of collaborative care agreement.    Yevonne Pax, MD [facility] Pulmonary and Critical Care Sleep medicine

## 2020-05-21 NOTE — Patient Instructions (Signed)
Pulmonary Hypertension Pulmonary hypertension is a long-term (chronic) condition in which there is high blood pressure in the arteries in the lungs (pulmonary arteries). This condition occurs when pulmonary arteries become narrow and tight, making it harder for blood to flow through the lungs. This in turn makes the heart work harder to pump blood through the lungs, making it harder for you to breathe. Over time, pulmonary hypertension can weaken and damage the heart muscle, specifically the right side of the heart. Pulmonary hypertension is a serious condition that can be life-threatening. What are the causes? This condition may be caused by different medical conditions. It can be categorized by cause into five groups:  Group 1: Pulmonary hypertension that is caused by abnormal growth of small blood vessels in the lungs (pulmonary arterial hypertension). The abnormal blood vessel growth may have no known cause, or it may be: ? Passed from parent to child (hereditary). ? Caused by another disease, such as a connective tissue disease (including lupus or scleroderma), congenital heart disease, liver disease, or HIV. ? Caused by certain medicines or poisons (toxins).  Group 2: Pulmonary hypertension that is caused by weakness of the left chamber of the heart (left ventricle) or heart valve disease.  Group 3: Pulmonary hypertension that is caused by lung disease or low oxygen levels. Causes in this group include: ? Emphysema or chronic obstructive pulmonary disease (COPD). ? Untreated sleep apnea. ? Pulmonary fibrosis. ? Long-term exposure to high altitudes in certain people who may already be at higher risk for pulmonary hypertension.  Group 4: Pulmonary hypertension that is caused by blood clots in the lungs (pulmonary emboli).  Group 5: Other causes of pulmonary hypertension, such as sickle cell anemia, sarcoidosis, tumors pressing on the pulmonary arteries, and various other diseases. What are  the signs or symptoms? Symptoms of this condition include:  Shortness of breath. You may notice shortness of breath with: ? Activity, such as walking. ? Minimal activity, such as getting dressed. ? No activity, like when you are sitting still.  A cough. Sometimes, bloody mucus from the lungs may be coughed up (hemoptysis).  Tiredness and fatigue.  Dizziness, lightheadedness, or fainting, especially with physical activity.  Rapid heartbeat, or feeling your heart flutter or skip a beat (palpitations).  Veins in the neck getting larger.  Swelling of the lower legs, abdomen, or both.  Bluish color of the lips and fingertips.  Chest pain or tightness in the chest.  Abdominal pain, especially in the upper abdomen. How is this diagnosed? This condition may be diagnosed based on one or more of the following tests:  Chest X-ray.  Blood tests.  CT scan.  Pulmonary function test. This test measures how much air your lungs can hold. It also tests how well air moves in and out of your lungs.  6-minute walk test. This tests how severe your condition is in relation to your activity levels.  Electrocardiogram (ECG). This test records the electrical impulses of the heart.  Echocardiogram. This test uses sound waves (ultrasound) to produce an image of the heart.  Cardiac catheterization. This is a procedure in which a thin tube (catheter) is passed into the pulmonary artery and used to test the pressure in your pulmonary artery and the right side of your heart.  Lung biopsy. This involves having a procedure to remove a small sample of lung tissue for testing. This may help determine an underlying cause of your pulmonary hypertension. How is this treated? There is no cure for   this condition, but treatment can help to relieve symptoms and slow the progress of the condition. Treatment may include:  Cardiac rehabilitation. This is a treatment program that includes exercise training,  education, and counseling to help you get stronger and return to an active lifestyle.  Oxygen therapy.  Medicines that: ? Lower blood pressure. ? Relax (dilate) the pulmonary blood vessels. ? Help the heart beat more efficiently and pump more blood. ? Help the body get rid of extra fluid (diuretics). ? Thin the blood in order to prevent blood clots in the lungs.  Lung surgery to relieve pressure on the heart, for severe cases that do not respond to medical treatment.  Heart-lung transplant, or lung transplant. This may be done in very severe cases. Follow these instructions at home: Eating and drinking   Eat a healthy diet that includes plenty of fresh fruits and vegetables, whole grains, and beans.  Limit your salt (sodium) intake to less than 2,300 mg a day. Lifestyle  Do not use any products that contain nicotine or tobacco, such as cigarettes and e-cigarettes. If you need help quitting, ask your health care provider.  Avoid secondhand smoke. Activity  Get plenty of rest.  Exercise as directed. Talk with your health care provider about what type of exercise is safe for you.  Avoid hot tubs and saunas.  Avoid high altitudes. General instructions  Take over-the-counter and prescription medicines only as told by your health care provider. Do not change or stop medicines without checking with your health care provider.  Stay up to date on your vaccines, especially yearly flu (influenza) and pneumonia vaccines.  If you are a woman of child-bearing age, avoid becoming pregnant. Talk with your health care provider about birth control.  Consider ways to get support for anxiety and stress of living with pulmonary hypertension. Talk with your health care provider about support groups and online resources.  Use oxygen therapy at home as directed.  Keep track of your weight. Weight gain could be a sign that your condition is getting worse.  Keep all follow-up visits as told by  your health care provider. This is important. Contact a health care provider if:  Your cough gets worse.  You have more shortness of breath than usual, or you start to have trouble doing activities that you could do before.  You need to use medicines or oxygen more frequently or in higher dosages than usual. Get help right away if:  You have severe shortness of breath.  You have chest pain or pressure.  You cough up blood.  You have swelling of your feet or legs that gets worse.  You have rapid weight gain over a period of 1-2 days.  Your medicines or oxygen do not provide relief. Summary  Pulmonary hypertension is a chronic condition in which there is high blood pressure in the arteries in the lungs (pulmonary arteries).  Pulmonary hypertension is a serious condition that can be life-threatening. It can be caused by a variety of illnesses.  Treatment may involve taking medicines and using oxygen therapy. Severe cases may require surgery or a transplant. This information is not intended to replace advice given to you by your health care provider. Make sure you discuss any questions you have with your health care provider. Document Revised: 08/26/2017 Document Reviewed: 12/07/2016 Elsevier Patient Education  2020 [employer]  

## 2020-05-26 ENCOUNTER — Other Ambulatory Visit: Payer: Self-pay

## 2020-05-26 DIAGNOSIS — I270 Primary pulmonary hypertension: Secondary | ICD-10-CM

## 2020-06-12 LAB — COMPREHENSIVE METABOLIC PANEL
ALT: 30 IU/L (ref 0–32)
AST: 24 IU/L (ref 0–40)
Albumin/Globulin Ratio: 2.1 (ref 1.2–2.2)
Albumin: 4.9 g/dL (ref 3.8–4.9)
Alkaline Phosphatase: 67 IU/L (ref 44–121)
BUN/Creatinine Ratio: 17 (ref 9–23)
BUN: 14 mg/dL (ref 6–24)
Bilirubin Total: 0.3 mg/dL (ref 0.0–1.2)
CO2: 26 mmol/L (ref 20–29)
Calcium: 10.2 mg/dL (ref 8.7–10.2)
Chloride: 97 mmol/L (ref 96–106)
Creatinine, Ser: 0.83 mg/dL (ref 0.57–1.00)
GFR calc Af Amer: 94 mL/min/{1.73_m2} (ref >59)
GFR calc non Af Amer: 82 mL/min/{1.73_m2} (ref >59)
Globulin, Total: 2.3 g/dL (ref 1.5–4.5)
Glucose: 158 mg/dL — ABNORMAL HIGH (ref 65–99)
Potassium: 3.6 mmol/L (ref 3.5–5.2)
Sodium: 137 mmol/L (ref 134–144)
Total Protein: 7.2 g/dL (ref 6.0–8.5)

## 2020-06-12 NOTE — Progress Notes (Signed)
Results reviewed, normal labs. Glucose level slightly elevated, we are monitoring A1C levels.

## 2020-06-18 ENCOUNTER — Telehealth: Payer: Self-pay

## 2020-06-18 NOTE — Telephone Encounter (Signed)
CONFIRMED PT APPT FOR 06/20/20 

## 2020-06-20 ENCOUNTER — Ambulatory Visit: Payer: BLUE CROSS/BLUE SHIELD

## 2020-06-20 ENCOUNTER — Other Ambulatory Visit: Payer: Self-pay

## 2020-06-20 DIAGNOSIS — I272 Pulmonary hypertension, unspecified: Secondary | ICD-10-CM

## 2020-06-20 DIAGNOSIS — I270 Primary pulmonary hypertension: Secondary | ICD-10-CM

## 2020-06-26 ENCOUNTER — Ambulatory Visit: Payer: BLUE CROSS/BLUE SHIELD | Admitting: Nurse Practitioner

## 2020-07-14 ENCOUNTER — Ambulatory Visit: Payer: BLUE CROSS/BLUE SHIELD | Admitting: Nurse Practitioner

## 2020-07-31 ENCOUNTER — Other Ambulatory Visit: Payer: Self-pay

## 2020-07-31 ENCOUNTER — Ambulatory Visit (INDEPENDENT_AMBULATORY_CARE_PROVIDER_SITE_OTHER): Payer: BLUE CROSS/BLUE SHIELD | Admitting: Nurse Practitioner

## 2020-07-31 VITALS — BP 136/89 | HR 94 | Temp 97.5°F | Resp 16 | Ht 64.0 in | Wt 205.0 lb

## 2020-07-31 DIAGNOSIS — I10 Essential (primary) hypertension: Secondary | ICD-10-CM | POA: Diagnosis not present

## 2020-07-31 DIAGNOSIS — E559 Vitamin D deficiency, unspecified: Secondary | ICD-10-CM

## 2020-07-31 DIAGNOSIS — I270 Primary pulmonary hypertension: Secondary | ICD-10-CM

## 2020-07-31 DIAGNOSIS — E1165 Type 2 diabetes mellitus with hyperglycemia: Secondary | ICD-10-CM | POA: Diagnosis not present

## 2020-07-31 LAB — POCT GLYCOSYLATED HEMOGLOBIN (HGB A1C): Hemoglobin A1C: 6.4 % — AB (ref 4.0–5.6)

## 2020-07-31 MED ORDER — FUROSEMIDE 20 MG PO TABS: ORAL_TABLET | ORAL | 3 refills | Status: AC

## 2020-07-31 MED ORDER — HYDROCHLOROTHIAZIDE 25 MG PO TABS: 25.0000 mg | ORAL_TABLET | Freq: Every day | ORAL | 3 refills | Status: DC

## 2020-07-31 MED ORDER — LISINOPRIL 40 MG PO TABS: ORAL_TABLET | ORAL | 3 refills | Status: AC

## 2020-07-31 MED ORDER — VITAMIN D (ERGOCALCIFEROL) 1.25 MG (50000 UNIT) PO CAPS: 50000.0000 [IU] | ORAL_CAPSULE | ORAL | 1 refills | Status: DC

## 2020-07-31 NOTE — Progress Notes (Signed)
[facility] [address] [place], [place] [address]  Internal MEDICINE  Office Visit Note  Patient Name: Carol Lewis  [id number]  [id number]  Date of Service: 08/23/2020  Chief Complaint  Patient presents with   Follow-up   Diabetes   Hypertension    The patient is here for follow up visit. Blood sugars are doing well. Her HgbA1c is 6.4, down from 7.0 at her last visit. Blood pressure is well controlled. She states that her breathing is managed well. Has barely had to use rescue inhaler. She has no new concerns or complaints today.       Current Medication: Outpatient Encounter Medications as of 07/31/2020  Medication Sig   ACCU-CHEK FASTCLIX LANCETS MISC Use once daily diag e11.65   furosemide (LASIX) 20 MG tablet Take 1 tab po by daily for swelling   glucose blood (ACCU-CHEK GUIDE) test strip 1 each by Other route daily. Use as instructed   glyBURIDE-metformin (GLUCOVANCE) 2.5-500 MG tablet Take 1 tab by po in the morning and 2 tab in the evening   hydrochlorothiazide (HYDRODIURIL) 25 MG tablet Take 1 tablet (25 mg total) by mouth daily.   lisinopril (ZESTRIL) 40 MG tablet TAKE 1 TABLET BY MOUTH DAILY FOR BLOOD PRESSURE   [DISCONTINUED] ergocalciferol (DRISDOL) 1.25 MG (50000 UT) capsule Take 1 capsule (50,000 Units total) by mouth once a week.   [DISCONTINUED] furosemide (LASIX) 20 MG tablet Take 1 tab po by daily for swelling   [DISCONTINUED] hydrochlorothiazide (HYDRODIURIL) 25 MG tablet Take 1 tablet (25 mg total) by mouth daily.   [DISCONTINUED] lisinopril (ZESTRIL) 40 MG tablet TAKE 1 TABLET BY MOUTH DAILY FOR BLOOD PRESSURE   Vitamin D, Ergocalciferol, (DRISDOL) 1.25 MG (50000 UNIT) CAPS capsule Take 1 capsule (50,000 Units total) by mouth once a week.   No facility-administered encounter medications on file as of 07/31/2020.    Surgical History: No past surgical history on file.  Medical History: Past Medical History:  Diagnosis  Date   Diabetes mellitus without complication (HCC)    Hypertension    Pulmonary hypertension (HCC)     Family History: Family History  Problem Relation Age of Onset   Breast cancer Mother    Diabetes Mother    Bladder Cancer Father     Social History   Socioeconomic History   Marital status: Married    Spouse name: Not on file   Number of children: Not on file   Years of education: Not on file   Highest education level: Not on file  Occupational History   Not on file  Tobacco Use   Smoking status: Never Smoker   Smokeless tobacco: Never Used  Vaping Use   Vaping Use: Never used  Substance and Sexual Activity   Alcohol use: No   Drug use: No   Sexual activity: Not on file  Other Topics Concern   Not on file  Social History Narrative   Not on file   Social Determinants of Health   Financial Resource Strain:    Difficulty of Paying Living Expenses: Not on file  Food Insecurity:    Worried About Running Out of Food in the Last Year: Not on file   Ran Out of Food in the Last Year: Not on file  Transportation Needs:    Lack of Transportation (Medical): Not on file   Lack of Transportation (Non-Medical): Not on file  Physical Activity:    Days of Exercise per Week: Not on file   Minutes  of Exercise per Session: Not on file  Stress:    Feeling of Stress : Not on file  Social Connections:    Frequency of Communication with Friends and Family: Not on file   Frequency of Social Gatherings with Friends and Family: Not on file   Attends Religious Services: Not on file   Active Member of Clubs or Organizations: Not on file   Attends Banker Meetings: Not on file   Marital Status: Not on file  Intimate Partner Violence:    Fear of Current or Ex-Partner: Not on file   Emotionally Abused: Not on file   Physically Abused: Not on file   Sexually Abused: Not on file      Review of Systems  Constitutional: Negative  for activity change, appetite change, chills, fatigue and fever.  HENT: Negative for congestion, ear pain, facial swelling, hearing loss, rhinorrhea, sinus pain and sore throat.   Respiratory: Negative for apnea, cough, chest tightness, shortness of breath and wheezing.   Cardiovascular: Negative for chest pain, palpitations and leg swelling.       Good blood pressure today.   Gastrointestinal: Negative for abdominal distention, abdominal pain, anal bleeding, constipation, diarrhea, nausea and vomiting.  Endocrine: Negative for cold intolerance, heat intolerance, polyphagia and polyuria.       Blood sugars doing well   Musculoskeletal: Negative for arthralgias, back pain and myalgias.  Skin: Negative for color change and rash.  Allergic/Immunologic: Negative for environmental allergies.  Neurological: Negative for dizziness, tremors, facial asymmetry, weakness, numbness and headaches.  Hematological: Negative for adenopathy.  Psychiatric/Behavioral: Negative for behavioral problems and dysphoric mood. The patient is not nervous/anxious.     Today's Vitals   07/31/20 1401  BP: 136/89  Pulse: 94  Resp: 16  Temp: (!) 97.5 F (36.4 C)  SpO2: 99%  Weight: 205 lb (93 kg)  Height: 5\' 4"  (1.626 m)   Body mass index is 35.19 kg/m.  Physical Exam Vitals and nursing note reviewed.  Constitutional:      Appearance: Normal appearance. She is well-developed. She is obese.  HENT:     Head: Normocephalic and atraumatic.     Nose: Nose normal.  Eyes:     Conjunctiva/sclera: Conjunctivae normal.     Pupils: Pupils are equal, round, and reactive to light.  Neck:     Thyroid: No thyromegaly.     Vascular: No carotid bruit.  Cardiovascular:     Rate and Rhythm: Normal rate and regular rhythm.     Heart sounds: Normal heart sounds.  Pulmonary:     Effort: Pulmonary effort is normal.     Breath sounds: Normal breath sounds. No wheezing.  Abdominal:     Palpations: Abdomen is soft.   Musculoskeletal:        General: Normal range of motion.     Cervical back: Normal range of motion and neck supple.  Lymphadenopathy:     Cervical: No cervical adenopathy.  Skin:    General: Skin is warm and dry.  Neurological:     Mental Status: She is alert and oriented to person, place, and time.  Psychiatric:        Behavior: Behavior normal.        Thought Content: Thought content normal.        Judgment: Judgment normal.    Assessment/Plan:  1. Uncontrolled type 2 diabetes mellitus with hyperglycemia (HCC) - POCT HgB A1C 6.4 today, down from 7.0 at her last check. Continue diabetic medication  as prescribed. Monitor closely.   2. Essential hypertension Stable. Continue all BP medications as prescribed.  - hydrochlorothiazide (HYDRODIURIL) 25 MG tablet; Take 1 tablet (25 mg total) by mouth daily.  Dispense: 90 tablet; Refill: 3 - furosemide (LASIX) 20 MG tablet; Take 1 tab po by daily for swelling  Dispense: 90 tablet; Refill: 3 - lisinopril (ZESTRIL) 40 MG tablet; TAKE 1 TABLET BY MOUTH DAILY FOR BLOOD PRESSURE  Dispense: 90 tablet; Refill: 3  3. Pulmonary hypertension, primary (HCC) Well managed. Continue to follow up with pulmonology as scheduled.   4. Vitamin D deficiency Continue Drisdol weekly.  - Vitamin D, Ergocalciferol, (DRISDOL) 1.25 MG (50000 UNIT) CAPS capsule; Take 1 capsule (50,000 Units total) by mouth once a week.  Dispense: 12 capsule; Refill: 1   General Counseling: Billie verbalizes understanding of the findings of todays visit and agrees with plan of treatment. I have discussed any further diagnostic evaluation that may be needed or ordered today. We also reviewed her medications today. she has been encouraged to call the office with any questions or concerns that should arise related to todays visit.  Diabetes Counseling:  1. Addition of ACE inh/ ARB'S for nephroprotection. Microalbumin is updated  2. Diabetic foot care, prevention of  complications. Podiatry consult 3. Exercise and lose weight.  4. Diabetic eye examination, Diabetic eye exam is updated  5. Monitor blood sugar closlely. nutrition counseling.  6. Sign and symptoms of hypoglycemia including shaking sweating,confusion and headaches.  This patient was seen by Vincent Gros FNP Collaboration with Dr Lyndon Code as a part of collaborative care agreement  Orders Placed This Encounter  Procedures   POCT HgB A1C    Meds ordered this encounter  Medications   hydrochlorothiazide (HYDRODIURIL) 25 MG tablet    Sig: Take 1 tablet (25 mg total) by mouth daily.    Dispense:  90 tablet    Refill:  3    Please fill as 90 day prescription    Order Specific Question:   Supervising Provider    Answer:   Lyndon Code [[id number]]   furosemide (LASIX) 20 MG tablet    Sig: Take 1 tab po by daily for swelling    Dispense:  90 tablet    Refill:  3    Please fill as 90 day prescription    Order Specific Question:   Supervising Provider    Answer:   Lyndon Code [[id number]]   lisinopril (ZESTRIL) 40 MG tablet    Sig: TAKE 1 TABLET BY MOUTH DAILY FOR BLOOD PRESSURE    Dispense:  90 tablet    Refill:  3    Please fill as 90 day prescription    Order Specific Question:   Supervising Provider    Answer:   Lyndon Code [[id number]]   Vitamin D, Ergocalciferol, (DRISDOL) 1.25 MG (50000 UNIT) CAPS capsule    Sig: Take 1 capsule (50,000 Units total) by mouth once a week.    Dispense:  12 capsule    Refill:  1    Order Specific Question:   Supervising Provider    Answer:   Lyndon Code [[id number]]    Total time spent: 30 Minutes   Time spent includes review of chart, medications, test results, and follow up plan with the patient.      Dr Lyndon Code Internal medicine

## 2020-08-23 ENCOUNTER — Encounter: Payer: Self-pay | Admitting: Nurse Practitioner

## 2020-09-15 ENCOUNTER — Telehealth: Payer: Self-pay

## 2020-09-15 NOTE — Telephone Encounter (Signed)
Pt called that her BP is low 109/60,95/62 and 80/55  Make her dizziness and light headache as per dr Welton Flakes  advised to stopped all med for BP ,lasix and hctz and drink Gatorade and and keep track for BP and call us back tomorrow and also if symptoms get worse go to ED

## 2020-10-30 ENCOUNTER — Encounter: Payer: Self-pay | Admitting: Physician Assistant

## 2020-10-30 ENCOUNTER — Ambulatory Visit (INDEPENDENT_AMBULATORY_CARE_PROVIDER_SITE_OTHER): Payer: BLUE CROSS/BLUE SHIELD | Admitting: Physician Assistant

## 2020-10-30 DIAGNOSIS — E6609 Other obesity due to excess calories: Secondary | ICD-10-CM

## 2020-10-30 DIAGNOSIS — I270 Primary pulmonary hypertension: Secondary | ICD-10-CM

## 2020-10-30 DIAGNOSIS — Z6836 Body mass index (BMI) 36.0-36.9, adult: Secondary | ICD-10-CM

## 2020-10-30 DIAGNOSIS — E1165 Type 2 diabetes mellitus with hyperglycemia: Secondary | ICD-10-CM

## 2020-10-30 DIAGNOSIS — I10 Essential (primary) hypertension: Secondary | ICD-10-CM | POA: Diagnosis not present

## 2020-10-30 LAB — POCT GLYCOSYLATED HEMOGLOBIN (HGB A1C): Hemoglobin A1C: 6.8 % — AB (ref 4.0–5.6)

## 2020-10-30 NOTE — Progress Notes (Signed)
[facility] [address] [place], [place] [address]  Internal MEDICINE  Office Visit Note  Patient Name: Carol Lewis  [id number]  [id number]  Date of Service: 11/01/2020  Chief Complaint  Patient presents with   Follow-up   Hypertension   Diabetes    HPI Pt returns for f/u on HTN and diabetes. She has been doing well. She had called the office back in December due to low BP readings and was told to hold her medications and continue to monitor and f/ u in office. She watched it closely at home and restarted the HCTZ and Lasix, but stopped Lisinopril since her BP was controlled on just the two medications. Discussed she should continue the lisinopril and HCTZ and hold the lasix unless she has a lot of fluid/swelling or BP remains high. She will monitor closely with changing the medication. BG at home in AM has ranged 99-130, depending on if she ate late the night before. She watches what she eats and limits her sugar intake.  Current Medication: Outpatient Encounter Medications as of 10/30/2020  Medication Sig   ACCU-CHEK FASTCLIX LANCETS MISC Use once daily diag e11.65   furosemide (LASIX) 20 MG tablet Take 1 tab po by daily for swelling   glucose blood (ACCU-CHEK GUIDE) test strip 1 each by Other route daily. Use as instructed   glyBURIDE-metformin (GLUCOVANCE) 2.5-500 MG tablet Take 1 tab by po in the morning and 2 tab in the evening   hydrochlorothiazide (HYDRODIURIL) 25 MG tablet Take 1 tablet (25 mg total) by mouth daily.   Vitamin D, Ergocalciferol, (DRISDOL) 1.25 MG (50000 UNIT) CAPS capsule Take 1 capsule (50,000 Units total) by mouth once a week.   lisinopril (ZESTRIL) 40 MG tablet TAKE 1 TABLET BY MOUTH DAILY FOR BLOOD PRESSURE (Patient not taking: Reported on 10/30/2020)   No facility-administered encounter medications on file as of 10/30/2020.    Surgical History: History reviewed. No pertinent surgical history.  Medical History: Past Medical  History:  Diagnosis Date   Diabetes mellitus without complication (HCC)    Hypertension    Pulmonary hypertension (HCC)     Family History: Family History  Problem Relation Age of Onset   Breast cancer Mother    Diabetes Mother    Bladder Cancer Father     Social History   Socioeconomic History   Marital status: Married    Spouse name: Not on file   Number of children: Not on file   Years of education: Not on file   Highest education level: Not on file  Occupational History   Not on file  Tobacco Use   Smoking status: Never Smoker   Smokeless tobacco: Never Used  Vaping Use   Vaping Use: Never used  Substance and Sexual Activity   Alcohol use: No   Drug use: No   Sexual activity: Not on file  Other Topics Concern   Not on file  Social History Narrative   Not on file   Social Determinants of Health   Financial Resource Strain: Not on file  Food Insecurity: Not on file  Transportation Needs: Not on file  Physical Activity: Not on file  Stress: Not on file  Social Connections: Not on file  Intimate Partner Violence: Not on file      Review of Systems  Constitutional: Negative for chills, fatigue and unexpected weight change.  HENT: Negative for congestion, postnasal drip, rhinorrhea, sneezing and sore throat.   Eyes: Negative for redness.  Respiratory: Negative  for cough, chest tightness and shortness of breath.   Cardiovascular: Negative for chest pain and palpitations.  Gastrointestinal: Negative for abdominal pain, constipation, diarrhea, nausea and vomiting.  Genitourinary: Negative for dysuria and frequency.  Musculoskeletal: Negative for arthralgias, back pain, joint swelling and neck pain.  Skin: Negative for rash.  Neurological: Negative.  Negative for tremors and numbness.  Hematological: Negative for adenopathy. Does not bruise/bleed easily.  Psychiatric/Behavioral: Negative for behavioral problems (Depression), sleep  disturbance and suicidal ideas. The patient is not nervous/anxious.     Vital Signs: BP 130/90    Pulse 89    Temp 97.7 F (36.5 C)    Resp 16    Ht 5\' 4"  (1.626 m)    Wt 213 lb (96.6 kg)    SpO2 97%    BMI 36.56 kg/m    Physical Exam Constitutional:      General: She is not in acute distress.    Appearance: She is well-developed. She is obese. She is not diaphoretic.  HENT:     Head: Normocephalic and atraumatic.     Mouth/Throat:     Pharynx: No oropharyngeal exudate.  Eyes:     Pupils: Pupils are equal, round, and reactive to light.  Neck:     Thyroid: No thyromegaly.     Vascular: No JVD.     Trachea: No tracheal deviation.  Cardiovascular:     Rate and Rhythm: Normal rate and regular rhythm.     Heart sounds: Normal heart sounds. No murmur heard. No friction rub. No gallop.   Pulmonary:     Effort: Pulmonary effort is normal. No respiratory distress.     Breath sounds: No wheezing or rales.  Chest:     Chest wall: No tenderness.  Abdominal:     General: Bowel sounds are normal.     Palpations: Abdomen is soft.  Musculoskeletal:        General: Normal range of motion.     Cervical back: Normal range of motion and neck supple.  Lymphadenopathy:     Cervical: No cervical adenopathy.  Skin:    General: Skin is warm and dry.  Neurological:     Mental Status: She is alert and oriented to person, place, and time.     Cranial Nerves: No cranial nerve deficit.  Psychiatric:        Behavior: Behavior normal.        Thought Content: Thought content normal.        Judgment: Judgment normal.       Assessment/Plan: 1. Uncontrolled type 2 diabetes mellitus with hyperglycemia (HCC) - POCT HgB A1C was 6.8 today. This is elevated from 6.4 last visit, but still fairly controlled. Will continue to monitor and re-evaluate for possible medication change in 3 months if A1c continuing upward. She will continue Glucovance as prescribed. Will discuss alternative therapy that can  also help with weight loss.  2. Essential hypertension BP well controlled currently. She had to drop 1 of the medications due to low BP numbers. She had been doing HCTZ and lasix and stopped the lisinopril. Discussed that she should instead continue lisinopril since it is reno-protective for diabetic, and continue the HCTZ and monitor BP closely. She will use lasix only as needed if her BP starts to rise again or she has increasing LE edema. She will let know if she switches her meds again.  3. Pulmonary hypertension, primary (HCC)???  Need to look into this, previous DX without definitive diagnostics.  f/u with pulmonology.needs further diagnostics to look into definite diagnosis, might need overnight etc   4. Obesity Will work on weight loss through diet and exercise. Consider switching to [place] and rybelsus next visit to help with wt loss while managing diabetes.  General Counseling: kollins fenter understanding of the findings of todays visit and agrees with plan of treatment. I have discussed any further diagnostic evaluation that may be needed or ordered today. We also reviewed her medications today. she has been encouraged to call the office with any questions or concerns that should arise related to todays visit.   Orders Placed This Encounter  Procedures   POCT HgB A1C    No orders of the defined types were placed in this encounter.   Total time spent: 30 Minutes Time spent includes review of chart, medications, test results, and follow up plan with the patient.      Dr Lyndon Code Internal medicine

## 2020-11-12 ENCOUNTER — Ambulatory Visit (INDEPENDENT_AMBULATORY_CARE_PROVIDER_SITE_OTHER): Payer: BLUE CROSS/BLUE SHIELD | Admitting: Hospice and Palliative Medicine

## 2020-11-12 ENCOUNTER — Encounter: Payer: Self-pay | Admitting: Hospice and Palliative Medicine

## 2020-11-12 VITALS — BP 144/98 | HR 103 | Temp 97.4°F | Resp 16 | Ht 64.0 in | Wt 212.8 lb

## 2020-11-12 DIAGNOSIS — N939 Abnormal uterine and vaginal bleeding, unspecified: Secondary | ICD-10-CM

## 2020-11-12 DIAGNOSIS — I10 Essential (primary) hypertension: Secondary | ICD-10-CM

## 2020-11-12 DIAGNOSIS — N926 Irregular menstruation, unspecified: Secondary | ICD-10-CM

## 2020-11-12 NOTE — Progress Notes (Signed)
[facility] [address] [place], [place] [address]  Internal MEDICINE  Office Visit Note  Patient Name: Carol Lewis  [id number]  [id number]  Date of Service: 11/16/2020  Chief Complaint  Patient presents with  . Acute Visit    Abnormal menstrual bleeding, cycle in Jan lasted 6 days, spotted regularly 2 days after until now, Friday cramping and a gush but then it was over, lightly spotting yesterday      HPI Pt is here for a sick visit. C/o abnormal vaginal bleeding since January, over the last year has had irregular menstrual cycles, has missed few months of her cycle and some months will only have spotting--she was thinking she was going through menopause In January she had heavy menstrual bleeding for 6 days Had light spotting last week but over the weekend bleeding increased, had an episode of bleeding through her clothes, was sitting down and when she went to stand felt a rush of liquid and had bled through pads and her clothing    Current Medication:  Outpatient Encounter Medications as of 11/12/2020  Medication Sig  . ACCU-CHEK FASTCLIX LANCETS MISC Use once daily diag e11.65  . furosemide (LASIX) 20 MG tablet Take 1 tab po by daily for swelling  . glucose blood (ACCU-CHEK GUIDE) test strip 1 each by Other route daily. Use as instructed  . glyBURIDE-metformin (GLUCOVANCE) 2.5-500 MG tablet Take 1 tab by po in the morning and 2 tab in the evening  . hydrochlorothiazide (HYDRODIURIL) 25 MG tablet Take 1 tablet (25 mg total) by mouth daily.  Marland Kitchen lisinopril (ZESTRIL) 40 MG tablet TAKE 1 TABLET BY MOUTH DAILY FOR BLOOD PRESSURE (Patient not taking: Reported on 10/30/2020)  . Vitamin D, Ergocalciferol, (DRISDOL) 1.25 MG (50000 UNIT) CAPS capsule Take 1 capsule (50,000 Units total) by mouth once a week.   No facility-administered encounter medications on file as of 11/12/2020.      Medical History: Past Medical History:  Diagnosis Date  . Diabetes mellitus  without complication (HCC)   . Hypertension   . Pulmonary hypertension (HCC)      Vital Signs: BP (!) 144/98   Pulse (!) 103   Temp (!) 97.4 F (36.3 C)   Resp 16   Ht 5\' 4"  (1.626 m)   Wt 212 lb 12.8 oz (96.5 kg)   SpO2 98%   BMI 36.53 kg/m    Review of Systems  Constitutional: Negative for chills, diaphoresis and fatigue.  HENT: Negative for ear pain, postnasal drip and sinus pressure.   Eyes: Negative for photophobia, discharge, redness, itching and visual disturbance.  Respiratory: Negative for cough, shortness of breath and wheezing.   Cardiovascular: Negative for chest pain, palpitations and leg swelling.  Gastrointestinal: Negative for abdominal pain, constipation, diarrhea, nausea and vomiting.  Genitourinary: Positive for menstrual problem and vaginal bleeding. Negative for dysuria and flank pain.  Musculoskeletal: Negative for arthralgias, back pain, gait problem and neck pain.  Skin: Negative for color change.  Allergic/Immunologic: Negative for environmental allergies and food allergies.  Neurological: Negative for dizziness and headaches.  Hematological: Does not bruise/bleed easily.  Psychiatric/Behavioral: Negative for agitation, behavioral problems (depression) and hallucinations.    Physical Exam Vitals reviewed.  Constitutional:      Appearance: Normal appearance. She is obese.  Cardiovascular:     Rate and Rhythm: Normal rate and regular rhythm.     Pulses: Normal pulses.     Heart sounds: Normal heart sounds.  Pulmonary:     Effort: Pulmonary  effort is normal.     Breath sounds: Normal breath sounds.  Abdominal:     General: Abdomen is flat.     Palpations: Abdomen is soft.  Musculoskeletal:        General: Normal range of motion.     Cervical back: Normal range of motion.  Skin:    General: Skin is warm.  Neurological:     General: No focal deficit present.     Mental Status: She is alert and oriented to person, place, and time. Mental  status is at baseline.  Psychiatric:        Mood and Affect: Mood normal.        Behavior: Behavior normal.        Thought Content: Thought content normal.        Judgment: Judgment normal.    Assessment/Plan: 1. Abnormal bleeding in menstrual cycle Urgent US--will refer to GYN as appropriate after reviewing results Monitor CBC due to excessive bleeding - US Pelvic Complete With Transvaginal; Future - CBC w/Diff/Platelet  2. Essential hypertension BP and HR elevated today, normally well controlled Anxious today--will closely monitor  General Counseling: Carol Lewis verbalizes understanding of the findings of todays visit and agrees with plan of treatment. I have discussed any further diagnostic evaluation that may be needed or ordered today. We also reviewed her medications today. she has been encouraged to call the office with any questions or concerns that should arise related to todays visit.   Orders Placed This Encounter  Procedures  . US Pelvic Complete With Transvaginal  . CBC w/Diff/Platelet    Time spent: 25 Minutes Time spent includes review of chart, medications, test results and follow-up plan with the patient.  This patient was seen by Leeanne Deed AGNP-C in Collaboration with Dr Lyndon Code as a part of collaborative care agreement.  Lubertha Basque [facility] Internal Medicine

## 2020-11-13 LAB — CBC WITH DIFFERENTIAL/PLATELET
Basophils Absolute: 0.1 10*3/uL (ref 0.0–0.2)
Basos: 1 %
EOS (ABSOLUTE): 0.4 10*3/uL (ref 0.0–0.4)
Eos: 5 %
Hematocrit: 43.9 % (ref 34.0–46.6)
Hemoglobin: 14.6 g/dL (ref 11.1–15.9)
Immature Grans (Abs): 0.1 10*3/uL (ref 0.0–0.1)
Immature Granulocytes: 1 %
Lymphocytes Absolute: 2.2 10*3/uL (ref 0.7–3.1)
Lymphs: 24 %
MCH: 29.6 pg (ref 26.6–33.0)
MCHC: 33.3 g/dL (ref 31.5–35.7)
MCV: 89 fL (ref 79–97)
Monocytes Absolute: 0.5 10*3/uL (ref 0.1–0.9)
Monocytes: 5 %
Neutrophils Absolute: 5.9 10*3/uL (ref 1.4–7.0)
Neutrophils: 64 %
Platelets: 348 10*3/uL (ref 150–450)
RBC: 4.93 x10E6/uL (ref 3.77–5.28)
RDW: 13.2 % (ref 11.7–15.4)
WBC: 9.2 10*3/uL (ref 3.4–10.8)

## 2020-11-16 ENCOUNTER — Encounter: Payer: Self-pay | Admitting: Hospice and Palliative Medicine

## 2020-11-20 ENCOUNTER — Ambulatory Visit: Payer: BLUE CROSS/BLUE SHIELD | Admitting: Nurse Practitioner

## 2020-12-11 ENCOUNTER — Other Ambulatory Visit: Payer: Self-pay

## 2020-12-11 ENCOUNTER — Other Ambulatory Visit: Payer: Self-pay | Admitting: Hospice and Palliative Medicine

## 2020-12-11 ENCOUNTER — Telehealth: Payer: Self-pay

## 2020-12-11 DIAGNOSIS — N939 Abnormal uterine and vaginal bleeding, unspecified: Secondary | ICD-10-CM

## 2020-12-11 DIAGNOSIS — N926 Irregular menstruation, unspecified: Secondary | ICD-10-CM

## 2020-12-11 NOTE — Telephone Encounter (Signed)
Pt advised about U/S result that we did stat referral for GYN and also spoke with beth about do referral ASAp

## 2021-01-14 ENCOUNTER — Other Ambulatory Visit: Payer: Self-pay | Admitting: Nurse Practitioner

## 2021-01-14 DIAGNOSIS — E559 Vitamin D deficiency, unspecified: Secondary | ICD-10-CM

## 2021-01-15 ENCOUNTER — Telehealth: Payer: Self-pay

## 2021-01-15 NOTE — Telephone Encounter (Signed)
I spoke with patient to see if she had seen [facility] urogynecology. She stated she had. She is scheduled for total hysterectomy 01/29/21 due to cancer. She will have office fax over office notes for our records-Toni

## 2021-01-19 ENCOUNTER — Other Ambulatory Visit: Payer: Self-pay

## 2021-01-19 DIAGNOSIS — E559 Vitamin D deficiency, unspecified: Secondary | ICD-10-CM

## 2021-01-19 MED ORDER — VITAMIN D (ERGOCALCIFEROL) 1.25 MG (50000 UNIT) PO CAPS: 50000.0000 [IU] | ORAL_CAPSULE | ORAL | 1 refills | Status: AC

## 2021-01-20 ENCOUNTER — Other Ambulatory Visit: Payer: Self-pay | Admitting: Nurse Practitioner

## 2021-01-20 ENCOUNTER — Other Ambulatory Visit: Payer: Self-pay

## 2021-01-20 DIAGNOSIS — E1165 Type 2 diabetes mellitus with hyperglycemia: Secondary | ICD-10-CM

## 2021-01-20 DIAGNOSIS — I10 Essential (primary) hypertension: Secondary | ICD-10-CM

## 2021-01-20 MED ORDER — GLYBURIDE-METFORMIN 2.5-500 MG PO TABS: ORAL_TABLET | ORAL | 1 refills | Status: AC

## 2021-01-20 MED ORDER — HYDROCHLOROTHIAZIDE 25 MG PO TABS: 25.0000 mg | ORAL_TABLET | Freq: Every day | ORAL | 3 refills | Status: AC

## 2021-01-29 ENCOUNTER — Encounter: Payer: BLUE CROSS/BLUE SHIELD | Admitting: Hospice and Palliative Medicine

## 2021-04-06 ENCOUNTER — Telehealth: Payer: Self-pay

## 2021-04-06 NOTE — Telephone Encounter (Signed)
Patient called requesting medical records to be faxed to other provider. I explained either they need to fax [place] request, or she can come into office and sign release.I told her she needs to allow up to 30 days to send. She will come to office to sign release-Toni

## 2021-04-06 NOTE — Telephone Encounter (Signed)
Patient signed medical release to send her records to [facility]. I gave Carol Lewis

## 2021-04-08 ENCOUNTER — Telehealth: Payer: Self-pay

## 2021-04-08 NOTE — Telephone Encounter (Signed)
Completed medical records for [facility] to [address] [place], [place] [address]

## 2021-04-27 ENCOUNTER — Telehealth: Payer: Self-pay

## 2021-04-27 NOTE — Telephone Encounter (Signed)
Patient called requesting her medical records. I sent secure chat to Alex-Toni

## 2021-04-27 NOTE — Telephone Encounter (Signed)
LMOM regarding records request, informed her she needs a records release and we could fax one or she could pick it up, and I let her know we can mail records or call her when they're ready for pick up.

## 2021-05-01 ENCOUNTER — Telehealth: Payer: Self-pay

## 2021-05-01 NOTE — Telephone Encounter (Signed)
LMOM to see if pt was still our pt and if she wanted to R/S her CPE. Need to add appt. note to eval drug refill patterns

## 2021-05-04 ENCOUNTER — Telehealth: Payer: Self-pay

## 2021-05-04 NOTE — Telephone Encounter (Signed)
Completed medical records for pt to pick up, spoke to pt and she will be back before the end of the day to receive them.

## 2021-06-09 ENCOUNTER — Encounter: Payer: BLUE CROSS/BLUE SHIELD | Admitting: Nurse Practitioner

## 2021-07-16 ENCOUNTER — Other Ambulatory Visit: Payer: Self-pay | Admitting: Internal Medicine

## 2021-07-16 DIAGNOSIS — E559 Vitamin D deficiency, unspecified: Secondary | ICD-10-CM

## 2021-07-16 NOTE — Telephone Encounter (Signed)
Please see this

## 2021-07-16 NOTE — Telephone Encounter (Signed)
Seems like pt has cancelled several app. She is due for physical, Vitamin D level will need o be checked before refilling it. She can schedule her physical app at her convenience, seems like she is being followed by another physician for an active problem

## 2021-07-17 ENCOUNTER — Telehealth: Payer: Self-pay

## 2021-07-17 NOTE — Telephone Encounter (Signed)
Received vitamin D refill from pharmacy. I lvm for patient to return my call to schedule appointment-Toni

## 2021-07-20 NOTE — Telephone Encounter (Signed)
Left patient another message to call office for appointment-Carol Lewis
